# Patient Record
Sex: Male | Born: 1935 | Race: White | Hispanic: No | Smoking: Former smoker
Health system: Southern US, Community
[De-identification: ages and names within clinical notes are randomized; demographics above are authoritative.]

## PROBLEM LIST (undated history)

## (undated) DIAGNOSIS — E785 Hyperlipidemia, unspecified: Secondary | ICD-10-CM

## (undated) DIAGNOSIS — F0390 Unspecified dementia without behavioral disturbance: Secondary | ICD-10-CM

## (undated) DIAGNOSIS — I10 Essential (primary) hypertension: Secondary | ICD-10-CM

## (undated) DIAGNOSIS — J45909 Unspecified asthma, uncomplicated: Secondary | ICD-10-CM

## (undated) DIAGNOSIS — G4730 Sleep apnea, unspecified: Secondary | ICD-10-CM

## (undated) DIAGNOSIS — I2510 Atherosclerotic heart disease of native coronary artery without angina pectoris: Secondary | ICD-10-CM

## (undated) DIAGNOSIS — I219 Acute myocardial infarction, unspecified: Secondary | ICD-10-CM

## (undated) DIAGNOSIS — I4891 Unspecified atrial fibrillation: Secondary | ICD-10-CM

## (undated) DIAGNOSIS — M1990 Unspecified osteoarthritis, unspecified site: Secondary | ICD-10-CM

## (undated) HISTORY — PX: OTHER SURGICAL HISTORY: SHX169

## (undated) HISTORY — PX: KIDNEY STONE SURGERY: SHX686

## (undated) HISTORY — PX: BRAIN SURGERY: SHX531

## (undated) HISTORY — PX: CARDIAC SURGERY: SHX584

## (undated) HISTORY — PX: TONSILLECTOMY: SUR1361

## (undated) HISTORY — PX: CORONARY ARTERY BYPASS GRAFT: SHX141

---

## 2015-05-26 ENCOUNTER — Encounter (HOSPITAL_COMMUNITY): Payer: Self-pay | Admitting: Emergency Medicine

## 2015-05-26 ENCOUNTER — Emergency Department (HOSPITAL_COMMUNITY): Payer: Medicare HMO

## 2015-05-26 ENCOUNTER — Emergency Department (HOSPITAL_COMMUNITY)
Admission: EM | Admit: 2015-05-26 | Discharge: 2015-05-26 | Disposition: A | Discharge status: Home / Self Care | Payer: Medicare HMO | Attending: Emergency Medicine | Admitting: Emergency Medicine

## 2015-05-26 DIAGNOSIS — Z7951 Long term (current) use of inhaled steroids: Secondary | ICD-10-CM | POA: Diagnosis not present

## 2015-05-26 DIAGNOSIS — Z7982 Long term (current) use of aspirin: Secondary | ICD-10-CM | POA: Insufficient documentation

## 2015-05-26 DIAGNOSIS — J45909 Unspecified asthma, uncomplicated: Secondary | ICD-10-CM | POA: Diagnosis not present

## 2015-05-26 DIAGNOSIS — Z79899 Other long term (current) drug therapy: Secondary | ICD-10-CM | POA: Diagnosis not present

## 2015-05-26 DIAGNOSIS — Z87442 Personal history of urinary calculi: Secondary | ICD-10-CM | POA: Insufficient documentation

## 2015-05-26 DIAGNOSIS — R109 Unspecified abdominal pain: Secondary | ICD-10-CM | POA: Diagnosis present

## 2015-05-26 DIAGNOSIS — I10 Essential (primary) hypertension: Secondary | ICD-10-CM | POA: Diagnosis not present

## 2015-05-26 DIAGNOSIS — G8918 Other acute postprocedural pain: Secondary | ICD-10-CM | POA: Diagnosis not present

## 2015-05-26 DIAGNOSIS — M1990 Unspecified osteoarthritis, unspecified site: Secondary | ICD-10-CM | POA: Insufficient documentation

## 2015-05-26 DIAGNOSIS — T8384XA Pain from genitourinary prosthetic devices, implants and grafts, initial encounter: Secondary | ICD-10-CM

## 2015-05-26 HISTORY — DX: Essential (primary) hypertension: I10

## 2015-05-26 HISTORY — DX: Unspecified asthma, uncomplicated: J45.909

## 2015-05-26 HISTORY — DX: Unspecified osteoarthritis, unspecified site: M19.90

## 2015-05-26 LAB — URINALYSIS, ROUTINE W REFLEX MICROSCOPIC
Bilirubin Urine: NEGATIVE
GLUCOSE, UA: NEGATIVE mg/dL
KETONES UR: NEGATIVE mg/dL
Nitrite: NEGATIVE
Protein, ur: 30 mg/dL — AB
Specific Gravity, Urine: 1.011 (ref 1.005–1.030)
Urobilinogen, UA: 0.2 mg/dL (ref 0.0–1.0)
pH: 5.5 (ref 5.0–8.0)

## 2015-05-26 LAB — BASIC METABOLIC PANEL
Anion gap: 10 (ref 5–15)
BUN: 29 mg/dL — AB (ref 6–20)
CHLORIDE: 101 mmol/L (ref 101–111)
CO2: 31 mmol/L (ref 22–32)
CREATININE: 1.26 mg/dL — AB (ref 0.61–1.24)
Calcium: 9.5 mg/dL (ref 8.9–10.3)
GFR calc Af Amer: 59 mL/min — ABNORMAL LOW (ref >60)
GFR calc non Af Amer: 51 mL/min — ABNORMAL LOW (ref >60)
Glucose, Bld: 155 mg/dL — ABNORMAL HIGH (ref 65–99)
Potassium: 3.5 mmol/L (ref 3.5–5.1)
SODIUM: 142 mmol/L (ref 135–145)

## 2015-05-26 LAB — CBC WITH DIFFERENTIAL/PLATELET
BASOS PCT: 0 % (ref 0–1)
Basophils Absolute: 0 10*3/uL (ref 0.0–0.1)
EOS PCT: 2 % (ref 0–5)
Eosinophils Absolute: 0.2 10*3/uL (ref 0.0–0.7)
HCT: 42.2 % (ref 39.0–52.0)
HEMOGLOBIN: 14.9 g/dL (ref 13.0–17.0)
LYMPHS PCT: 13 % (ref 12–46)
Lymphs Abs: 1.4 10*3/uL (ref 0.7–4.0)
MCH: 31.2 pg (ref 26.0–34.0)
MCHC: 35.3 g/dL (ref 30.0–36.0)
MCV: 88.5 fL (ref 78.0–100.0)
MONOS PCT: 12 % (ref 3–12)
Monocytes Absolute: 1.3 10*3/uL — ABNORMAL HIGH (ref 0.1–1.0)
NEUTROS ABS: 8.3 10*3/uL — AB (ref 1.7–7.7)
NEUTROS PCT: 73 % (ref 43–77)
Platelets: 155 10*3/uL (ref 150–400)
RBC: 4.77 MIL/uL (ref 4.22–5.81)
RDW: 13.2 % (ref 11.5–15.5)
WBC: 11.2 10*3/uL — ABNORMAL HIGH (ref 4.0–10.5)

## 2015-05-26 LAB — URINE MICROSCOPIC-ADD ON

## 2015-05-26 MED ORDER — TAMSULOSIN HCL 0.4 MG PO CAPS: 0.4000 mg | ORAL_CAPSULE | Freq: Every day | ORAL | Status: DC

## 2015-05-26 NOTE — ED Provider Notes (Signed)
CSN: [id number]     Arrival date & time 05/26/15  1358 History   First MD Initiated Contact with Patient 05/26/15 1407     Chief Complaint  Patient presents with  . Flank Pain  . Urinary Retention     (Consider location/radiation/quality/duration/timing/severity/associated sxs/prior Treatment) Patient is a 81 y.o. male presenting with abdominal pain. The history is provided by the patient and a relative.  Abdominal Pain Pain location:  Suprapubic Pain quality: sharp and shooting   Pain radiates to:  Does not radiate Pain severity:  Moderate Onset quality:  Gradual Duration:  2 weeks Timing:  Constant Progression:  Worsening Chronicity:  New Relieved by:  Nothing Worsened by:  Urination Ineffective treatments:  None tried Associated symptoms: dysuria and hematuria   Associated symptoms: no chest pain, no chills, no diarrhea, no fever, no shortness of breath and no vomiting    81 yo M with a chief complaint of suprapubic abdominal pain. Patient is been going on for quite some time ever since he had a ureteral stent placed for a kidney stone. Patient just moved here from [place] has not yet gotten to see a urologist. Patient has had some blood with his urine. Patient denies fevers chills denies nausea or vomiting. Patient has been taking narcotics but only to help him sleep. Family is concerned because he fell about a month ago and they're concerned that the stent may have been displaced.  Past Medical History  Diagnosis Date  . Arthritis   . Asthma   . Hypertension    Past Surgical History  Procedure Laterality Date  . Cardiac surgery    . Coronary artery bypass graft    . Kidney stone surgery    . Brain surgery    . Brain bleed     History reviewed. No pertinent family history. History  Substance Use Topics  . Smoking status: Not on file  . Smokeless tobacco: Not on file  . Alcohol Use: Not on file    Review of Systems  Constitutional: Negative for fever and  chills.  HENT: Negative for congestion and facial swelling.   Eyes: Negative for discharge and visual disturbance.  Respiratory: Negative for shortness of breath.   Cardiovascular: Negative for chest pain and palpitations.  Gastrointestinal: Negative for vomiting, abdominal pain and diarrhea.  Genitourinary: Positive for dysuria and hematuria. Negative for flank pain, discharge, penile swelling, scrotal swelling, penile pain and testicular pain.  Musculoskeletal: Negative for myalgias and arthralgias.  Skin: Negative for color change and rash.  Neurological: Negative for tremors, syncope and headaches.  Psychiatric/Behavioral: Negative for confusion and dysphoric mood.      Allergies  Review of patient's allergies indicates no known allergies.  Home Medications   Prior to Admission medications   Medication Sig Start Date End Date Taking? Authorizing Provider  amLODipine (NORVASC) 10 MG tablet Take 5 mg by mouth daily.   Yes Historical Provider, MD  aspirin EC 81 MG tablet Take 81 mg by mouth daily.    Historical Provider, MD  Cholecalciferol (VITAMIN D PO) Take 1 tablet by mouth daily.   Yes Historical Provider, MD  ferrous sulfate 325 (65 FE) MG tablet Take 325 mg by mouth daily with breakfast.   Yes Historical Provider, MD  fluticasone (FLONASE) 50 MCG/ACT nasal spray Place 2 sprays into both nostrils daily.   Yes Historical Provider, MD  furosemide (LASIX) 40 MG tablet Take 40 mg by mouth daily.   Yes Historical Provider, MD  HYDROcodone-acetaminophen (NORCO/VICODIN)  5-325 MG per tablet Take 1 tablet by mouth at bedtime as needed for moderate pain.   Yes Historical Provider, MD  mometasone [facility]) 220 MCG/INH inhaler Inhale 2 puffs into the lungs at bedtime.   Yes Historical Provider, MD  Omega-3 Fatty Acids (FISH OIL PO) Take 1 capsule by mouth daily.   Yes Historical Provider, MD  polyethylene glycol (MIRALAX / GLYCOLAX) packet Take 17 g by mouth daily as needed for moderate  constipation.   Yes Historical Provider, MD  simvastatin (ZOCOR) 40 MG tablet Take 20 mg by mouth at bedtime.   Yes Historical Provider, MD  tamsulosin (FLOMAX) 0.4 MG CAPS capsule Take 1 capsule (0.4 mg total) by mouth daily after supper. 05/26/15   Melene Plan, DO  traZODone (DESYREL) 50 MG tablet Take 50 mg by mouth at bedtime.   Yes Historical Provider, MD   BP 155/74 mmHg  Pulse 81  Temp(Src) 98.8 F (37.1 C) (Oral)  Resp 18  SpO2 99% Physical Exam  Constitutional: He is oriented to person, place, and time. He appears well-developed and well-nourished.  HENT:  Head: Normocephalic and atraumatic.  Eyes: EOM are normal. Pupils are equal, round, and reactive to light.  Neck: Normal range of motion. Neck supple. No JVD present.  Cardiovascular: Normal rate and regular rhythm.  Exam reveals no gallop and no friction rub.   No murmur heard. Pulmonary/Chest: No respiratory distress. He has no wheezes.  Abdominal: He exhibits no distension. There is tenderness (suprapubic). There is no rebound and no guarding.  Musculoskeletal: Normal range of motion. He exhibits no edema or tenderness.  Neurological: He is alert and oriented to person, place, and time.  Skin: No rash noted. No pallor.  Psychiatric: He has a normal mood and affect. His behavior is normal.    ED Course  Procedures (including critical care time) Labs Review Labs Reviewed  CBC WITH DIFFERENTIAL/PLATELET - Abnormal; Notable for the following:    WBC 11.2 (*)    Neutro Abs 8.3 (*)    Monocytes Absolute 1.3 (*)    All other components within normal limits  BASIC METABOLIC PANEL - Abnormal; Notable for the following:    Glucose, Bld 155 (*)    BUN 29 (*)    Creatinine, Ser 1.26 (*)    GFR calc non Af Amer 51 (*)    GFR calc Af Amer 59 (*)    All other components within normal limits  URINALYSIS, ROUTINE W REFLEX MICROSCOPIC (NOT AT [facility]) - Abnormal; Notable for the following:    APPearance CLOUDY (*)    Hgb urine  dipstick LARGE (*)    Protein, ur 30 (*)    Leukocytes, UA MODERATE (*)    All other components within normal limits  URINE MICROSCOPIC-ADD ON - Abnormal; Notable for the following:    Bacteria, UA FEW (*)    All other components within normal limits  URINE CULTURE    Imaging Review Dg Abd 1 View  05/26/2015   CLINICAL DATA:  Right flank ecchymosis, hematuria, right mid abdominal pain. Placement right nephro ureteral stent remotely.  EXAM: ABDOMEN - 1 VIEW  COMPARISON:  None.  FINDINGS: Right nephro ureteral stent in place with proximal portion at the expected location of the right kidney, although the kidney itself is not well visualized due to overlying bowel gas and stool, and distal portion projecting over the expected location of the bladder. Bones are subjectively osteopenic. No abnormal radiopacity is identified projecting along the length of  the right sided stent. Punctate radiopacities over the right upper renal pole region and left lower renal pole region could represent renal calculi although bowel content could appear similar. Vascular calcifications are noted.  IMPRESSION: Right-sided nephro ureteral stent in apparently good position radiographically, although position and presence or absence of complication would be best evaluated at CT with contrast.   Electronically Signed   By: Christiana Pellant M.D.   On: 05/26/2015 15:48     EKG Interpretation None      MDM   Final diagnoses:  Pain due to ureteral stent, initial encounter    81 yo M with suprapubic abdominal pain. Will obtain a urine CBC BMP, KUB to evaluate stent location.  KUB with sentinel good position as read by me. Lab work with mild leukocytosis as well as creatinine of 1.26 no baseline to compare. UA moderate leukocytes only 3- 6 whites and few bacteria. Urine sent for culture and will not treat with this dipstick result.  Urology follow-up. Trial of flomax for bladder spasms.  4:05 PM:  I have discussed the  diagnosis/risks/treatment options with the patient and family and believe the pt to be eligible for discharge home to follow-up with Urology. We also discussed returning to the ED immediately if new or worsening sx occur. We discussed the sx which are most concerning (e.g., fever, vomting) that necessitate immediate return. Medications administered to the patient during their visit and any new prescriptions provided to the patient are listed below.  Medications given during this visit Medications - No data to display  New Prescriptions   TAMSULOSIN (FLOMAX) 0.4 MG CAPS CAPSULE    Take 1 capsule (0.4 mg total) by mouth daily after supper.     The patient appears reasonably screen and/or stabilized for discharge and I doubt any other medical condition or other [facility] requiring further screening, evaluation, or treatment in the ED at this time prior to discharge.    Melene Plan, DO 05/26/15 1606

## 2015-05-26 NOTE — ED Notes (Signed)
Pt complaining of right mid abdominal pain starting today. Hx of a uretal stent placed to right side x 4 months ago, had a fall x 1 month ago and daughter states he bruised the right flank. Today large pink/purple bruise to right flank visible on exam. States since he had the stent he's had a small amount of blood in his urine since, and has only been able to urinate in small amounts multiple times throughout the day since his fall.

## 2015-05-27 LAB — URINE CULTURE: Culture: 3000

## 2015-09-05 ENCOUNTER — Other Ambulatory Visit: Payer: Self-pay | Admitting: Urology

## 2015-09-22 NOTE — Patient Instructions (Addendum)
Jacqlyn LarsenRichard Brereton  09/22/2015   Your procedure is scheduled on: 10/03/2015    Report [facility]  Entrance take [place]  elevators to 3rd floor to  Short Stay Center at    0530 AM.  Call this number if you have problems the morning of surgery [phone]   Remember: ONLY 1 PERSON MAY GO WITH YOU TO SHORT STAY TO GET  READY MORNING OF YOUR SURGERY.  Do not eat food or drink liquids :After Midnight.     Take these medicines the morning of surgery with A SIP OF WATER:   Amlodipine ( NOrvasc),, Flonase, Asmanex inhaler and bring                                 You may not have any metal on your body including hair pins and              piercings  Do not wear jewelry,  lotions, powders or perfumes, deodorant                         Men may shave face and neck.   Do not bring valuables to the hospital. [facility] IS NOT             RESPONSIBLE   FOR VALUABLES.  Contacts, dentures or bridgework may not be worn into surgery.       Patients discharged the day of surgery will not be allowed to drive home.  Name and phone number of your driver:  Special Instructions:coughing and deep breathing exercises, leg exercises               Please read over the following fact sheets you were given: _____________________________________________________________________             [facility] - Preparing for Surgery Before surgery, you can play an important role.  Because skin is not sterile, your skin needs to be as free of germs as possible.  You can reduce the number of germs on your skin by washing with CHG (chlorahexidine gluconate) soap before surgery.  CHG is an antiseptic cleaner which kills germs and bonds with the skin to continue killing germs even after washing. Please DO NOT use if you have an allergy to CHG or antibacterial soaps.  If your skin becomes reddened/irritated stop using the CHG and inform your nurse when you arrive at Short Stay. Do not shave  (including legs and underarms) for at least 48 hours prior to the first CHG shower.  You may shave your face/neck. Please follow these instructions carefully:  1.  Shower with CHG Soap the night before surgery and the  morning of Surgery.  2.  If you choose to wash your hair, wash your hair first as usual with your  normal  shampoo.  3.  After you shampoo, rinse your hair and body thoroughly to remove the  shampoo.                           4.  Use CHG as you would any other liquid soap.  You can apply chg directly  to the skin and wash  Gently with a scrungie or clean washcloth.  5.  Apply the CHG Soap to your body ONLY FROM THE NECK DOWN.   Do not use on face/ open                           Wound or open sores. Avoid contact with eyes, ears mouth and genitals (private parts).                       Wash face,  Genitals (private parts) with your normal soap.             6.  Wash thoroughly, paying special attention to the area where your surgery  will be performed.  7.  Thoroughly rinse your body with warm water from the neck down.  8.  DO NOT shower/wash with your normal soap after using and rinsing off  the CHG Soap.                9.  Pat yourself dry with a clean towel.            10.  Wear clean pajamas.            11.  Place clean sheets on your bed the night of your first shower and do not  sleep with pets. Day of Surgery : Do not apply any lotions/deodorants the morning of surgery.  Please wear clean clothes to the hospital/surgery center.  FAILURE TO FOLLOW THESE INSTRUCTIONS MAY RESULT IN THE CANCELLATION OF YOUR SURGERY PATIENT SIGNATURE_________________________________  NURSE SIGNATURE__________________________________  ________________________________________________________________________

## 2015-09-26 ENCOUNTER — Encounter (HOSPITAL_COMMUNITY)
Admission: RE | Admit: 2015-09-26 | Discharge: 2015-09-26 | Disposition: A | Discharge status: Home / Self Care | Payer: Medicare HMO | Source: Ambulatory Visit | Attending: Urology | Admitting: Urology

## 2015-09-26 ENCOUNTER — Encounter (HOSPITAL_COMMUNITY): Payer: Self-pay

## 2015-09-26 DIAGNOSIS — Z01818 Encounter for other preprocedural examination: Secondary | ICD-10-CM | POA: Diagnosis present

## 2015-09-26 HISTORY — DX: Acute myocardial infarction, unspecified: I21.9

## 2015-09-26 HISTORY — DX: Atherosclerotic heart disease of native coronary artery without angina pectoris: I25.10

## 2015-09-26 HISTORY — DX: Sleep apnea, unspecified: G47.30

## 2015-09-26 LAB — CBC
HEMATOCRIT: 41.7 % (ref 39.0–52.0)
Hemoglobin: 14.4 g/dL (ref 13.0–17.0)
MCH: 30.1 pg (ref 26.0–34.0)
MCHC: 34.5 g/dL (ref 30.0–36.0)
MCV: 87.2 fL (ref 78.0–100.0)
PLATELETS: 231 10*3/uL (ref 150–400)
RBC: 4.78 MIL/uL (ref 4.22–5.81)
RDW: 13.1 % (ref 11.5–15.5)
WBC: 8.8 10*3/uL (ref 4.0–10.5)

## 2015-09-26 LAB — BASIC METABOLIC PANEL
Anion gap: 8 (ref 5–15)
BUN: 15 mg/dL (ref 6–20)
CO2: 34 mmol/L — ABNORMAL HIGH (ref 22–32)
CREATININE: 0.97 mg/dL (ref 0.61–1.24)
Calcium: 9.6 mg/dL (ref 8.9–10.3)
Chloride: 99 mmol/L — ABNORMAL LOW (ref 101–111)
GFR calc Af Amer: >60 mL/min (ref >60)
GLUCOSE: 198 mg/dL — AB (ref 65–99)
POTASSIUM: 3.5 mmol/L (ref 3.5–5.1)
SODIUM: 141 mmol/L (ref 135–145)

## 2015-09-26 NOTE — Progress Notes (Signed)
Requested by fax from [facility] in [place] with Signed Medical Release Form - LOV, EKG , Stress, Echo and any other recent heart tests done.

## 2015-09-26 NOTE — Progress Notes (Signed)
PCP note0 04/18/15 on chart  ECHO-4.21.15- on chart  CT abdomen and pelvis- 02/17/15 on chart  D/C summary- 02/08/14 on chart  Stress Test- 10/27/13 on chart  03/04/15- EKG on chart

## 2015-09-30 NOTE — Progress Notes (Signed)
Received from [facility] in [place]: 07/27/2015- ECHO 07/27/15- EKG  Cardiology Follow Up Note- 07/27/15 and 07/19/15

## 2015-09-30 NOTE — Progress Notes (Signed)
On 09/28/2015 rerequested info for 2nd time from [facility] [place] [facility].

## 2015-09-30 NOTE — Progress Notes (Signed)
Called [facility] in [place] at [phone] Ext [phone] and requested medical record info by phone.  They stated they would send medical records from [facility] in [place].

## 2015-10-02 ENCOUNTER — Encounter (HOSPITAL_COMMUNITY): Payer: Self-pay | Admitting: Anesthesiology

## 2015-10-02 NOTE — Anesthesia Preprocedure Evaluation (Addendum)
Anesthesia Evaluation  Patient identified by MRN, date of birth, ID band Patient awake    Reviewed: Allergy & Precautions, NPO status , Patient's Chart, lab work & pertinent test results  Airway Mallampati: II  TM Distance: >3 FB Neck ROM: Full    Dental no notable dental hx.    Pulmonary asthma , sleep apnea , former smoker,    Pulmonary exam normal breath sounds clear to auscultation       Cardiovascular hypertension, Pt. on medications + CAD, + Past MI and + CABG  Normal cardiovascular exam Rhythm:Regular Rate:Normal     Neuro/Psych negative neurological ROS  negative psych ROS   GI/Hepatic negative GI ROS, Neg liver ROS,   Endo/Other  negative endocrine ROS  Renal/GU negative Renal ROS  negative genitourinary   Musculoskeletal  (+) Arthritis ,   Abdominal   Peds negative pediatric ROS (+)  Hematology negative hematology ROS (+)   Anesthesia Other Findings   Reproductive/Obstetrics negative OB ROS                            Anesthesia Physical Anesthesia Plan  ASA: III  Anesthesia Plan: General   Post-op Pain Management:    Induction: Intravenous  Airway Management Planned: LMA  Additional Equipment:   Intra-op Plan:   Post-operative Plan: Extubation in OR  Informed Consent: I have reviewed the patients History and Physical, chart, labs and discussed the procedure including the risks, benefits and alternatives for the proposed anesthesia with the patient or authorized representative who has indicated his/her understanding and acceptance.   Dental advisory given  Plan Discussed with: CRNA  Anesthesia Plan Comments:         Anesthesia Quick Evaluation

## 2015-10-03 ENCOUNTER — Ambulatory Visit (HOSPITAL_COMMUNITY): Payer: No Typology Code available for payment source

## 2015-10-03 ENCOUNTER — Ambulatory Visit (HOSPITAL_COMMUNITY): Payer: No Typology Code available for payment source | Admitting: Anesthesiology

## 2015-10-03 ENCOUNTER — Encounter (HOSPITAL_COMMUNITY): Payer: Self-pay | Admitting: *Deleted

## 2015-10-03 ENCOUNTER — Encounter (HOSPITAL_COMMUNITY)
Admission: RE | Disposition: A | Discharge status: Home / Self Care | Payer: Self-pay | Source: Ambulatory Visit | Attending: Urology

## 2015-10-03 ENCOUNTER — Ambulatory Visit (HOSPITAL_COMMUNITY)
Admission: RE | Admit: 2015-10-03 | Discharge: 2015-10-03 | Disposition: A | Discharge status: Home / Self Care | Payer: No Typology Code available for payment source | Source: Ambulatory Visit | Attending: Urology | Admitting: Urology

## 2015-10-03 DIAGNOSIS — I4891 Unspecified atrial fibrillation: Secondary | ICD-10-CM | POA: Insufficient documentation

## 2015-10-03 DIAGNOSIS — I252 Old myocardial infarction: Secondary | ICD-10-CM | POA: Insufficient documentation

## 2015-10-03 DIAGNOSIS — I2510 Atherosclerotic heart disease of native coronary artery without angina pectoris: Secondary | ICD-10-CM | POA: Insufficient documentation

## 2015-10-03 DIAGNOSIS — I509 Heart failure, unspecified: Secondary | ICD-10-CM | POA: Diagnosis not present

## 2015-10-03 DIAGNOSIS — Z7951 Long term (current) use of inhaled steroids: Secondary | ICD-10-CM | POA: Insufficient documentation

## 2015-10-03 DIAGNOSIS — Z7982 Long term (current) use of aspirin: Secondary | ICD-10-CM | POA: Insufficient documentation

## 2015-10-03 DIAGNOSIS — Z87891 Personal history of nicotine dependence: Secondary | ICD-10-CM | POA: Insufficient documentation

## 2015-10-03 DIAGNOSIS — Z79899 Other long term (current) drug therapy: Secondary | ICD-10-CM | POA: Insufficient documentation

## 2015-10-03 DIAGNOSIS — R350 Frequency of micturition: Secondary | ICD-10-CM | POA: Diagnosis present

## 2015-10-03 DIAGNOSIS — J45909 Unspecified asthma, uncomplicated: Secondary | ICD-10-CM | POA: Insufficient documentation

## 2015-10-03 DIAGNOSIS — G2581 Restless legs syndrome: Secondary | ICD-10-CM | POA: Insufficient documentation

## 2015-10-03 DIAGNOSIS — I110 Hypertensive heart disease with heart failure: Secondary | ICD-10-CM | POA: Diagnosis not present

## 2015-10-03 DIAGNOSIS — Z79891 Long term (current) use of opiate analgesic: Secondary | ICD-10-CM | POA: Diagnosis not present

## 2015-10-03 DIAGNOSIS — E119 Type 2 diabetes mellitus without complications: Secondary | ICD-10-CM | POA: Insufficient documentation

## 2015-10-03 DIAGNOSIS — M1990 Unspecified osteoarthritis, unspecified site: Secondary | ICD-10-CM | POA: Insufficient documentation

## 2015-10-03 DIAGNOSIS — N201 Calculus of ureter: Secondary | ICD-10-CM | POA: Insufficient documentation

## 2015-10-03 DIAGNOSIS — I714 Abdominal aortic aneurysm, without rupture: Secondary | ICD-10-CM | POA: Insufficient documentation

## 2015-10-03 DIAGNOSIS — E785 Hyperlipidemia, unspecified: Secondary | ICD-10-CM | POA: Insufficient documentation

## 2015-10-03 DIAGNOSIS — Z951 Presence of aortocoronary bypass graft: Secondary | ICD-10-CM | POA: Diagnosis not present

## 2015-10-03 DIAGNOSIS — Z87442 Personal history of urinary calculi: Secondary | ICD-10-CM | POA: Diagnosis not present

## 2015-10-03 DIAGNOSIS — G4730 Sleep apnea, unspecified: Secondary | ICD-10-CM | POA: Insufficient documentation

## 2015-10-03 HISTORY — PX: CYSTOSCOPY W/ URETERAL STENT REMOVAL: SHX1430

## 2015-10-03 SURGERY — REMOVAL, STENT, URETER, CYSTOSCOPIC
Anesthesia: General | Site: Ureter | Laterality: Right

## 2015-10-03 MED ORDER — PROPOFOL 10 MG/ML IV BOLUS
INTRAVENOUS | Status: AC
  Filled 2015-10-03: qty 20

## 2015-10-03 MED ORDER — PHENAZOPYRIDINE HCL 200 MG PO TABS: 200.0000 mg | ORAL_TABLET | Freq: Three times a day (TID) | ORAL | Status: DC | PRN

## 2015-10-03 MED ORDER — PROPOFOL 10 MG/ML IV BOLUS
INTRAVENOUS | Status: DC | PRN
  Administered 2015-10-03: 150 mg via INTRAVENOUS
  Administered 2015-10-03: 50 mg via INTRAVENOUS
  Administered 2015-10-03 (×3): 20 mg via INTRAVENOUS

## 2015-10-03 MED ORDER — PHENAZOPYRIDINE HCL 200 MG PO TABS
200.0000 mg | ORAL_TABLET | Freq: Once | ORAL | Status: AC
  Administered 2015-10-03: 200 mg via ORAL

## 2015-10-03 MED ORDER — PHENAZOPYRIDINE HCL 200 MG PO TABS
ORAL_TABLET | ORAL | Status: AC
  Filled 2015-10-03: qty 1

## 2015-10-03 MED ORDER — FENTANYL CITRATE (PF) 100 MCG/2ML IJ SOLN
INTRAMUSCULAR | Status: AC
  Filled 2015-10-03: qty 2

## 2015-10-03 MED ORDER — HYDROCODONE-ACETAMINOPHEN 7.5-325 MG PO TABS
1.0000 | ORAL_TABLET | Freq: Four times a day (QID) | ORAL | Status: DC | PRN
  Administered 2015-10-03: 1 via ORAL
  Filled 2015-10-03: qty 1

## 2015-10-03 MED ORDER — FENTANYL CITRATE (PF) 100 MCG/2ML IJ SOLN
INTRAMUSCULAR | Status: DC | PRN
  Administered 2015-10-03: 50 ug via INTRAVENOUS
  Administered 2015-10-03 (×2): 25 ug via INTRAVENOUS

## 2015-10-03 MED ORDER — LACTATED RINGERS IV SOLN: INTRAVENOUS | Status: DC

## 2015-10-03 MED ORDER — HYDROCODONE-ACETAMINOPHEN 7.5-325 MG PO TABS: 1.0000 | ORAL_TABLET | ORAL | Status: AC | PRN

## 2015-10-03 MED ORDER — CIPROFLOXACIN IN D5W 400 MG/200ML IV SOLN
400.0000 mg | INTRAVENOUS | Status: AC
  Administered 2015-10-03: 400 mg via INTRAVENOUS

## 2015-10-03 MED ORDER — IOHEXOL 300 MG/ML  SOLN
INTRAMUSCULAR | Status: DC | PRN
  Administered 2015-10-03: 20 mL

## 2015-10-03 MED ORDER — LACTATED RINGERS IV SOLN
INTRAVENOUS | Status: DC | PRN
  Administered 2015-10-03: 07:00:00 via INTRAVENOUS

## 2015-10-03 MED ORDER — CIPROFLOXACIN IN D5W 400 MG/200ML IV SOLN
INTRAVENOUS | Status: AC
  Filled 2015-10-03: qty 200

## 2015-10-03 MED ORDER — SODIUM CHLORIDE 0.9 % IR SOLN
Status: DC | PRN
  Administered 2015-10-03: 4000 mL

## 2015-10-03 MED ORDER — FENTANYL CITRATE (PF) 100 MCG/2ML IJ SOLN: 25.0000 ug | INTRAMUSCULAR | Status: DC | PRN

## 2015-10-03 SURGICAL SUPPLY — 19 items
BAG URO CATCHER STRL LF (DRAPE) ×3 IMPLANT
BASKET ZERO TIP NITINOL 2.4FR (BASKET) IMPLANT
CATH INTERMIT  6FR 70CM (CATHETERS) ×3 IMPLANT
CLOTH BEACON ORANGE TIMEOUT ST (SAFETY) ×3 IMPLANT
FIBER LASER FLEXIVA 1000 (UROLOGICAL SUPPLIES) IMPLANT
FIBER LASER FLEXIVA 200 (UROLOGICAL SUPPLIES) IMPLANT
FIBER LASER FLEXIVA 365 (UROLOGICAL SUPPLIES) IMPLANT
FIBER LASER FLEXIVA 550 (UROLOGICAL SUPPLIES) IMPLANT
FIBER LASER TRAC TIP (UROLOGICAL SUPPLIES) IMPLANT
GLOVE BIOGEL M 8.0 STRL (GLOVE) ×6 IMPLANT
GOWN STRL REUS W/ TWL XL LVL3 (GOWN DISPOSABLE) ×2 IMPLANT
GOWN STRL REUS W/TWL XL LVL3 (GOWN DISPOSABLE) ×4 IMPLANT
GUIDEWIRE ANG ZIPWIRE 038X150 (WIRE) IMPLANT
GUIDEWIRE COONS BENTSON MOVE (WIRE) ×3 IMPLANT
GUIDEWIRE STR DUAL SENSOR (WIRE) ×3 IMPLANT
MANIFOLD NEPTUNE II (INSTRUMENTS) ×3 IMPLANT
PACK CYSTO (CUSTOM PROCEDURE TRAY) ×3 IMPLANT
STENT CONTOUR 6FRX26X.038 (STENTS) ×3 IMPLANT
TUBING CONNECTING 10 (TUBING) ×3 IMPLANT

## 2015-10-03 NOTE — Anesthesia Postprocedure Evaluation (Signed)
Anesthesia Post Note  Patient: Austin Roberts  Procedure(s) Performed: Procedure(s) (LRB): CYSTOSCOPY WITH RIGHT STENT REMOVAL, RETROGRADE WITH RIGHT STENT PLACEMENT (Right)  Patient location during evaluation: PACU Anesthesia Type: General Level of consciousness: awake and alert Pain management: pain level controlled Vital Signs Assessment: post-procedure vital signs reviewed and stable Respiratory status: spontaneous breathing, nonlabored ventilation, respiratory function stable and patient connected to nasal cannula oxygen Cardiovascular status: blood pressure returned to baseline and stable Postop Assessment: no signs of nausea or vomiting Anesthetic complications: no    Last Vitals:  Filed Vitals:   10/03/15 0845 10/03/15 0900  BP: 171/63 177/67  Pulse: 66 80  Temp: 36.7 C 36.6 C  Resp: 13 14    Last Pain:  Filed Vitals:   10/03/15 0956  PainSc: 2                  [name] J

## 2015-10-03 NOTE — Transfer of Care (Signed)
Immediate Anesthesia Transfer of Care Note  Patient: Austin Roberts  Procedure(s) Performed: Procedure(s): CYSTOSCOPY WITH RIGHT STENT REMOVAL, RETROGRADE WITH RIGHT STENT PLACEMENT (Right)  Patient Location: PACU  Anesthesia Type:General  Level of Consciousness:  sedated, patient cooperative and responds to stimulation  Airway & Oxygen Therapy:Patient Spontanous Breathing and Patient connected to face mask oxgen  Post-op Assessment:  Report given to PACU RN and Post -op Vital signs reviewed and stable  Post vital signs:  Reviewed and stable  Last Vitals:  Filed Vitals:   10/03/15 0505 10/03/15 0815  BP: 147/73 157/99  Pulse: 89 80  Temp: 36.6 C   Resp: 16 17    Complications: No apparent anesthesia complications

## 2015-10-03 NOTE — H&P (Signed)
Austin Roberts is an 81 year old male with a history of calculus disease and a right ureteral stent.   History of Present Illness He was seen in the emergency room on 05/26/15 at that time experiencing a 2 week history of sharp shooting pain in the suprapubic region associated with dysuria and hematuria. He reported having moved from [place] and having a stent in his right ureter. A KUB obtained at that time revealed bilateral multiple renal calculi and a stent in the right ureter in good position. I received from the [facility] that indicated a question of stent removal and a KUB obtained which revealed the stent remaining present within the ureter.  He has a somewhat complex urologic history in that he underwent left ureteroscopy for ureteral stone and a stent was left but removed. He also had a stone on the right hand side and therefore underwent right ureteroscopy and had a stent placed back in 5/16. He then moved away to live with his daughter down here in [place] and there appears to be in a lot of time with him trying to get into the [facility] and eventually he underwent cystoscopy at the [facility] and they attempted to remove the stent but were unsuccessful and said that they curled up the stent in his bladder. He is having a lot of frequency and urgency.  He has undergone a previous TURP.   Past Medical History Problems  1. History of Coronary artery disease (I25.10) 2. History of abdominal aortic aneurysm (AAA) (Z86.79) 3. History of asthma (Z87.09) 4. History of atrial fibrillation (Z86.79) 5. History of congestive heart failure (Z86.79) 6. History of hyperlipidemia (Z86.39) 7. History of hypertension (Z86.79) 8. History of sleep apnea (Z87.09) 9. History of type 2 diabetes mellitus (Z86.39) 10. History of Restless leg syndrome (G25.81)  Surgical History Problems  1. History of CABG 2. History of Transurethral Resection Of Prostate (TURP)  Current Meds 1. AmLODIPine Besylate 10 MG Oral Tablet;  Therapy: (Recorded:11Nov2016) to Recorded 2. Asmanex 30 Metered Doses 220 MCG/INH Inhalation Aerosol Powder Breath Activated;  Therapy: (Recorded:11Nov2016) to Recorded 3. Aspirin 81 MG TABS;  Therapy: (Recorded:11Nov2016) to Recorded 4. Ferrous Sulfate CAPS;  Therapy: (Recorded:11Nov2016) to Recorded 5. Fluticasone Propionate 50 MCG/ACT Nasal Suspension;  Therapy: (Recorded:11Nov2016) to Recorded 6. Furosemide 40 MG Oral Tablet;  Therapy: (Recorded:11Nov2016) to Recorded 7. Hydrocodone-Acetaminophen 7.5-325 MG Oral Tablet;  Therapy: (Recorded:11Nov2016) to Recorded 8. MiraLax Oral Powder;  Therapy: (Recorded:11Nov2016) to Recorded 9. Simvastatin 20 MG Oral Tablet;  Therapy: (Recorded:11Nov2016) to Recorded 10. Systane SOLN;   Therapy: (Recorded:11Nov2016) to Recorded 11. TraZODone HCl - 50 MG Oral Tablet;   Therapy: (Recorded:11Nov2016) to Recorded 12. Vitamin D3 CAPS;   Therapy: (Recorded:11Nov2016) to Recorded  Allergies Medication  1. doxazosin  Family History Problems  1. Family history of malignant neoplasm (Z80.9) : Sister 2. Family history of malignant neoplasm of brain (Z80.8) : Father 3. Family history of stroke (Z82.3) : Mother  Social History Problems    Denied: History of Alcohol use   Caffeine use (F15.90)   Former smoker [phone](Z87.891)   1ppdx50 years ago   Number of children   1 daughter  Review of Systems Genitourinary, constitutional, skin, eye, otolaryngeal, hematologic/lymphatic, cardiovascular, pulmonary, endocrine, musculoskeletal, gastrointestinal, neurological and psychiatric system(s) were reviewed and pertinent findings if present are noted and are otherwise negative.  Genitourinary: urinary frequency, nocturia, weak urinary stream and urinary stream starts and stops.  Gastrointestinal: constipation.  Constitutional: recent weight loss.  Musculoskeletal: joint pain.  Psychiatric: anxiety.  Vitals Vital Signs Height: 5 ft 10 in Weight:  171 lb  BMI Calculated: 24.54 BSA Calculated: 1.95 Blood Pressure: 154 / 76 Heart Rate: 64  Physical Exam Constitutional: Well nourished and well developed . No acute distress.   ENT:. The ears and nose are normal in appearance.   Neck: The appearance of the neck is normal and no neck mass is present.   Pulmonary: No respiratory distress and normal respiratory rhythm and effort.   Cardiovascular: Heart rate and rhythm are normal . No peripheral edema.   Abdomen: The abdomen is soft and nontender. No masses are palpated. No CVA tenderness. No hernias are palpable. No hepatosplenomegaly noted.   Lymphatics: The femoral and inguinal nodes are not enlarged or tender.   Skin: Normal skin turgor, no visible rash and no visible skin lesions.   Neuro/Psych:. Mood and affect are appropriate.    Results/Data  Old records or history reviewed: ER notes as above.  The following images/tracing/specimen were independently visualized:  KUB as above.      Assessment   I obtained a KUB and the stent appears to have been pulled down into the bladder part way although still remains up near the ureteropelvic junction on the right hand side. I cannot see any definite calcification along the stent but I suspect that's why they could not remove the stent at the [facility].  I had a long discussion with the patient and his daughter about the fact that this is a very difficult case because the stent likely has some calcification buildup on it. Because of that I'm not going to try to remove the stent in the office but will take him to the operating room and I told him there I would attempt to remove the stent but likely would also consider placing a guidewire next to the stent to maintain access and might even leave a second stent in place next to his current stent to allow his ureter to dilate further and possibly allow ureteroscopy to be performed with laser lithotripsy of stone on his stent without having to go  percutaneously to obtain the remainder of his stent. We discussed this procedure at length as well as the possible risks and complications, the probability of success, the possible need for second and possibly even a third procedure, the outpatient nature of the procedure as well as the anticipated postoperative course. I told him I would check a urine culture in preparation for his surgery because of his irritative voiding symptoms and we'll schedule him for this surgery.   Plan   1. His urine was cultured preoperatively and found to be negative.   2. He will be scheduled for cystoscopy, right stent removal, possible laser lithotripsy, possible ureteroscopy and right stent replacement.

## 2015-10-03 NOTE — Anesthesia Procedure Notes (Signed)
Procedure Name: LMA Insertion Date/Time: 10/03/2015 7:37 AM Performed by: Paris LoreBLANTON, Juliyah Mergen M Pre-anesthesia Checklist: Patient identified, Emergency Drugs available, Suction available, Patient being monitored and Timeout performed Patient Re-evaluated:Patient Re-evaluated prior to inductionOxygen Delivery Method: Circle system utilized Preoxygenation: Pre-oxygenation with 100% oxygen Intubation Type: IV induction Ventilation: Mask ventilation without difficulty LMA: LMA inserted LMA Size: 4.0 Number of attempts: 1 Placement Confirmation: positive ETCO2 and breath sounds checked- equal and bilateral Tube secured with: Tape

## 2015-10-03 NOTE — Discharge Instructions (Signed)

## 2015-10-03 NOTE — Op Note (Signed)
PATIENT:  Austin Roberts  PRE-OPERATIVE DIAGNOSIS: Retained right double-J stent  POST-OPERATIVE DIAGNOSIS: Same  PROCEDURE: 1. Cystoscopy with right retrograde pyelogram including interpretation 2. Cystolitholapaxy 3. Removal of retained stent 4. Placement of new double-J stent  SURGEON:  Garnett FarmMark C Isiaah Cuervo  INDICATION: Austin Roberts is a 81 year old male with a history of calculus disease. He underwent stent placement after treatment of a stone in his right ureter in 5/16. [facility] [place] [facility] [place] and [facility] and [facility]. This had been taking quite a bit of time and he underwent an attempt at removal of his stent but this was unsuccessful. He does not appear to have a great deal of encrustation of the proximal portion of the stent which has pulled down into the area of the UPJ. He is brought to the operating room today for an attempt at removal of his retained stent.  ANESTHESIA:  General  EBL:  Minimal  DRAINS: 6 French, 24 cm double-J stent in the right ureter (with string)  LOCAL MEDICATIONS USED:  None  SPECIMEN:  None  Description of procedure: After informed consent the patient was taken to the operating room and placed on the table in a supine position. General anesthesia was then administered. Once fully anesthetized the patient was moved to the dorsal lithotomy position and the genitalia were sterilely prepped and draped in standard fashion. An official timeout was then performed.  The 23 French rigid cystoscope with 30 lens was then passed down the urethra which is noted be normal. The prostatic urethra revealed evidence of prior resection and there was no evidence of any lesions within the prostatic urethra. Upon entering the bladder I noted the left ureteral orifice was of normal configuration and position and the right ureteral orifice had some edema surrounding it with the stent exiting the ureteral orifice. The stent had  significant encrustation and actually had become fused together where the curl of the stent brought the 2 portions of the stent in approximation. There was 2+ trabeculation. No tumors or other lesions were identified.  I first performed a right retrograde pyelogram. This was performed by passing a 6 [place]French open-ended catheter through the cystoscope and I attempted to pass it into the right ureteral orifice next to the stent but was unsuccessful. I therefore passed a 0.038 inch floppy-tipped sensor guidewire through the open-ended catheter, into the right ureteral orifice and up the right ureter next to the stent into the area of the renal pelvis directed by real-time fluoroscopy. I then advanced the open-ended catheter over the guidewire to the level of the ureteropelvic junction and remove the guidewire. Under fluoroscopy I injected full-strength Omnipaque contrast through the open-ended stent which outlined the renal pelvis which appeared to be generous with small, short infundibule portending each calyx. The calyces themselves did not appear to be dilated. I then passed the guidewire back through the open-ended stent and left this in the area of the renal pelvis and removed the open-ended stent.  I then passed the alligator forceps through the cystoscope and performed cystolitholapaxy by crushing the stone that was incrusting his stent in order to free up the loops of the stent and free up the distal aspect so that it could be grasped. I was able to grasp the tip of the stent and I withdrew this through the cystoscope maintaining the tip of the cystoscope at the ureteral orifice for stability. As I did this I was able  to pull the stent out with little to no resistance whatsoever and the entire stent was removed. It was inspected and had minimal encrustation on the proximal portion.  I backloaded the cystoscope over the guidewire and passed a new stent over the guidewire into the area the renal pelvis as the  guidewire was removed there was good curl noted on the proximal aspect of the stent and of the distal aspect within the bladder. The bladder was then drained and the cystoscope removed. The tether on the distal aspect of the stent was affixed to the dorsum of the penis and the patient was awakened and taken to recovery room in stable and satisfactory condition. There were no intraoperative complications.  PLAN OF CARE: Discharge to home after PACU  PATIENT DISPOSITION:  PACU - hemodynamically stable.

## 2017-02-01 ENCOUNTER — Observation Stay (HOSPITAL_COMMUNITY)
Admission: EM | Admit: 2017-02-01 | Discharge: 2017-02-02 | Disposition: A | Discharge status: Home / Self Care | Payer: Medicare HMO | Attending: Internal Medicine | Admitting: Internal Medicine

## 2017-02-01 ENCOUNTER — Encounter (HOSPITAL_COMMUNITY): Payer: Self-pay | Admitting: Emergency Medicine

## 2017-02-01 DIAGNOSIS — M1990 Unspecified osteoarthritis, unspecified site: Secondary | ICD-10-CM | POA: Diagnosis present

## 2017-02-01 DIAGNOSIS — E876 Hypokalemia: Secondary | ICD-10-CM | POA: Diagnosis not present

## 2017-02-01 DIAGNOSIS — Z87891 Personal history of nicotine dependence: Secondary | ICD-10-CM | POA: Diagnosis not present

## 2017-02-01 DIAGNOSIS — R931 Abnormal findings on diagnostic imaging of heart and coronary circulation: Secondary | ICD-10-CM | POA: Diagnosis not present

## 2017-02-01 DIAGNOSIS — E785 Hyperlipidemia, unspecified: Secondary | ICD-10-CM | POA: Diagnosis present

## 2017-02-01 DIAGNOSIS — G4730 Sleep apnea, unspecified: Secondary | ICD-10-CM | POA: Diagnosis present

## 2017-02-01 DIAGNOSIS — Z951 Presence of aortocoronary bypass graft: Secondary | ICD-10-CM | POA: Diagnosis not present

## 2017-02-01 DIAGNOSIS — J189 Pneumonia, unspecified organism: Secondary | ICD-10-CM

## 2017-02-01 DIAGNOSIS — I252 Old myocardial infarction: Secondary | ICD-10-CM | POA: Insufficient documentation

## 2017-02-01 DIAGNOSIS — J45909 Unspecified asthma, uncomplicated: Secondary | ICD-10-CM | POA: Diagnosis not present

## 2017-02-01 DIAGNOSIS — I2510 Atherosclerotic heart disease of native coronary artery without angina pectoris: Secondary | ICD-10-CM | POA: Diagnosis not present

## 2017-02-01 DIAGNOSIS — I10 Essential (primary) hypertension: Secondary | ICD-10-CM | POA: Diagnosis present

## 2017-02-01 DIAGNOSIS — R0902 Hypoxemia: Secondary | ICD-10-CM

## 2017-02-01 DIAGNOSIS — I482 Chronic atrial fibrillation, unspecified: Secondary | ICD-10-CM | POA: Diagnosis present

## 2017-02-01 DIAGNOSIS — R799 Abnormal finding of blood chemistry, unspecified: Secondary | ICD-10-CM | POA: Diagnosis present

## 2017-02-01 HISTORY — DX: Hyperlipidemia, unspecified: E78.5

## 2017-02-01 LAB — CBC
HEMATOCRIT: 34.3 % — AB (ref 39.0–52.0)
HEMOGLOBIN: 12.2 g/dL — AB (ref 13.0–17.0)
MCH: 30.2 pg (ref 26.0–34.0)
MCHC: 35.6 g/dL (ref 30.0–36.0)
MCV: 84.9 fL (ref 78.0–100.0)
Platelets: 201 10*3/uL (ref 150–400)
RBC: 4.04 MIL/uL — ABNORMAL LOW (ref 4.22–5.81)
RDW: 13.9 % (ref 11.5–15.5)
WBC: 9.2 10*3/uL (ref 4.0–10.5)

## 2017-02-01 LAB — BASIC METABOLIC PANEL
ANION GAP: 9 (ref 5–15)
BUN: 11 mg/dL (ref 6–20)
CO2: 28 mmol/L (ref 22–32)
Calcium: 8.8 mg/dL — ABNORMAL LOW (ref 8.9–10.3)
Chloride: 104 mmol/L (ref 101–111)
Creatinine, Ser: 0.95 mg/dL (ref 0.61–1.24)
GFR calc Af Amer: >60 mL/min (ref >60)
GLUCOSE: 147 mg/dL — AB (ref 65–99)
POTASSIUM: 2.9 mmol/L — AB (ref 3.5–5.1)
Sodium: 141 mmol/L (ref 135–145)

## 2017-02-01 LAB — I-STAT TROPONIN, ED: Troponin i, poc: 0.06 ng/mL (ref 0.00–0.08)

## 2017-02-01 LAB — PHOSPHORUS: PHOSPHORUS: 3.2 mg/dL (ref 2.5–4.6)

## 2017-02-01 LAB — MAGNESIUM: Magnesium: 1.9 mg/dL (ref 1.7–2.4)

## 2017-02-01 MED ORDER — POTASSIUM CHLORIDE 10 MEQ/100ML IV SOLN
10.0000 meq | INTRAVENOUS | Status: AC
  Administered 2017-02-01 (×3): 10 meq via INTRAVENOUS
  Filled 2017-02-01 (×3): qty 100

## 2017-02-01 MED ORDER — FLUTICASONE PROPIONATE 50 MCG/ACT NA SUSP
2.0000 | Freq: Every day | NASAL | Status: DC
  Filled 2017-02-01: qty 16

## 2017-02-01 MED ORDER — BUDESONIDE 0.5 MG/2ML IN SUSP
0.5000 mg | Freq: Two times a day (BID) | RESPIRATORY_TRACT | Status: DC
Start: 2017-02-01 — End: 2017-02-02
  Administered 2017-02-02: 0.5 mg via RESPIRATORY_TRACT
  Filled 2017-02-01: qty 2

## 2017-02-01 MED ORDER — HEPARIN SODIUM (PORCINE) 5000 UNIT/ML IJ SOLN
5000.0000 [IU] | Freq: Three times a day (TID) | INTRAMUSCULAR | Status: DC
  Administered 2017-02-01 – 2017-02-02 (×3): 5000 [IU] via SUBCUTANEOUS
  Filled 2017-02-01 (×3): qty 1

## 2017-02-01 MED ORDER — OMEGA-3-ACID ETHYL ESTERS 1 G PO CAPS
1.0000 g | ORAL_CAPSULE | Freq: Every day | ORAL | Status: DC
  Administered 2017-02-02: 1 g via ORAL
  Filled 2017-02-01: qty 1

## 2017-02-01 MED ORDER — HYDROCODONE-ACETAMINOPHEN 7.5-325 MG PO TABS
1.0000 | ORAL_TABLET | ORAL | Status: DC | PRN
  Administered 2017-02-01: 1 via ORAL
  Filled 2017-02-01: qty 1

## 2017-02-01 MED ORDER — VITAMIN D 1000 UNITS PO TABS
1000.0000 [IU] | ORAL_TABLET | Freq: Every day | ORAL | Status: DC
  Administered 2017-02-02: 1000 [IU] via ORAL
  Filled 2017-02-01: qty 1

## 2017-02-01 MED ORDER — TRAZODONE HCL 50 MG PO TABS
50.0000 mg | ORAL_TABLET | Freq: Every day | ORAL | Status: DC
  Administered 2017-02-01: 50 mg via ORAL
  Filled 2017-02-01: qty 1

## 2017-02-01 MED ORDER — AMLODIPINE BESYLATE 10 MG PO TABS
10.0000 mg | ORAL_TABLET | Freq: Every day | ORAL | Status: DC
  Administered 2017-02-02: 10 mg via ORAL
  Filled 2017-02-01: qty 1

## 2017-02-01 MED ORDER — KCL IN DEXTROSE-NACL 40-5-0.45 MEQ/L-%-% IV SOLN
INTRAVENOUS | Status: DC
  Administered 2017-02-02: 06:00:00 via INTRAVENOUS
  Filled 2017-02-01: qty 1000

## 2017-02-01 MED ORDER — ASPIRIN EC 81 MG PO TBEC
81.0000 mg | DELAYED_RELEASE_TABLET | Freq: Every day | ORAL | Status: DC
  Administered 2017-02-02: 81 mg via ORAL
  Filled 2017-02-01: qty 1

## 2017-02-01 MED ORDER — POLYVINYL ALCOHOL 1.4 % OP SOLN
1.0000 [drp] | Freq: Every day | OPHTHALMIC | Status: DC
Start: 2017-02-02 — End: 2017-02-02
  Filled 2017-02-01: qty 15

## 2017-02-01 MED ORDER — SIMVASTATIN 20 MG PO TABS
40.0000 mg | ORAL_TABLET | Freq: Every day | ORAL | Status: DC
  Administered 2017-02-01: 40 mg via ORAL
  Filled 2017-02-01: qty 2

## 2017-02-01 MED ORDER — MOMETASONE FUROATE 220 MCG/INH IN AEPB: 2.0000 | INHALATION_SPRAY | Freq: Every day | RESPIRATORY_TRACT | Status: DC

## 2017-02-01 MED ORDER — SODIUM CHLORIDE 0.9 % IV SOLN: 30.0000 meq | Freq: Once | INTRAVENOUS | Status: DC

## 2017-02-01 MED ORDER — FUROSEMIDE 20 MG PO TABS
40.0000 mg | ORAL_TABLET | Freq: Every day | ORAL | Status: DC
  Administered 2017-02-02: 40 mg via ORAL
  Filled 2017-02-01: qty 2

## 2017-02-01 MED ORDER — DOCUSATE SODIUM 100 MG PO CAPS: 100.0000 mg | ORAL_CAPSULE | Freq: Two times a day (BID) | ORAL | Status: DC | PRN

## 2017-02-01 NOTE — ED Triage Notes (Addendum)
[facility]; told to come to hospital for potassium related to 2.7 level. Pt denies pain or symptoms.

## 2017-02-01 NOTE — H&P (Signed)
History and Physical    Austin Roberts ZOX:[id number] DOB: Mar 26, 1929 DOA: 02/01/2017  PCP: Aida Puffer, MD   Patient coming from: Home.  I have personally briefly reviewed patient's old medical records in [facility]  Chief Complaint: Low potassium level.  HPI: Austin Roberts is a 83 y.o. male with medical history significant of chronic atrial fibrillation (no longer on Coumadin/denies history of warfarin-induced bleeding), osteoarthritis of the spine and hands, asthma, coronary artery disease, hyperlipidemia, hypertension, sleep apnea not on CPAP who is being referred to the emergency department [facility] who asked him to come to the hospital for further evaluation after his potassium level was 2.7 mmol/L. He denies headache, sore throat, productive cough, chest pain, dyspnea, dizziness, PND, orthopnea, pitting edema lower extremities, abdominal pain, diarrhea, constipation, melena, hematochezia, dysuria, frequency or hematuria. He complains of an episode of palpitations yesterday at home, but denied having any other symptoms associated to it.  ED Course: The patient was started on a 30 mEq potassium replacement infusion in the emergency department. Rechecked potassium level was 2.9 mmol/L. His WBC 9.2, H&H 12.2 g/dL and platelets 811. His sodium 141, chloride 104 and bicarbonate 28 mmol/L. Magnesium and phosphorus were normal. His troponin level was normal, EKG show A. fib with VPCs, IVCD probable old anteroseptal infarct.  Review of Systems: As per HPI otherwise 10 point review of systems negative.    Past Medical History:  Diagnosis Date  . Arthritis    spine and hands   . Asthma   . Coronary artery disease   . Hyperlipidemia   . Hypertension   . Myocardial infarction   . Sleep apnea    not using CPAP- for 4 years     Past Surgical History:  Procedure Laterality Date  . Brain bleed    . BRAIN SURGERY    . CARDIAC SURGERY    . CORONARY ARTERY BYPASS GRAFT     . CYSTOSCOPY W/ URETERAL STENT REMOVAL Right 10/03/2015   Procedure: CYSTOSCOPY WITH RIGHT STENT REMOVAL, RETROGRADE WITH RIGHT STENT PLACEMENT;  Surgeon: Ihor Gully, MD;  Location: [facility];  Service: Urology;  Laterality: Right;  . KIDNEY STONE SURGERY    . TONSILLECTOMY       reports that he has quit smoking. He has never used smokeless tobacco. He reports that he does not drink alcohol or use drugs.  No Known Allergies  Family History  Problem Relation Age of Onset  . CAD Mother   . Lung cancer Father   . AAA (abdominal aortic aneurysm) Sister   . CAD Brother     Prior to Admission medications   Medication Sig Start Date End Date Taking? Authorizing Provider  amLODipine (NORVASC) 10 MG tablet Take 10 mg by mouth daily.    Yes Historical Provider, MD  aspirin EC 81 MG tablet Take 81 mg by mouth daily.   Yes Historical Provider, MD  cholecalciferol (VITAMIN D) 1000 units tablet Take 1,000 Units by mouth daily.   Yes Historical Provider, MD  docusate sodium (COLACE) 100 MG capsule Take 100 mg by mouth 2 (two) times daily as needed for mild constipation.   Yes Historical Provider, MD  fluticasone (FLONASE) 50 MCG/ACT nasal spray Place 2 sprays into both nostrils daily.   Yes Historical Provider, MD  furosemide (LASIX) 40 MG tablet Take 40 mg by mouth daily.   Yes Historical Provider, MD  HYDROcodone-acetaminophen (NORCO) 7.5-325 MG tablet Take 1-2 tablets by mouth every 4 (four) hours as needed  for moderate pain. Maximum dose per 24 hours - 8 pills 10/03/15  Yes Ihor Gully, MD  mometasone [facility]) 220 MCG/INH inhaler Inhale 2 puffs into the lungs daily.   Yes Historical Provider, MD  omega-3 acid ethyl esters (LOVAZA) 1 g capsule Take 1 g by mouth daily.   Yes Historical Provider, MD  Polyethyl Glycol-Propyl Glycol (SYSTANE) 0.4-0.3 % SOLN Place 1 drop into both eyes daily.   Yes Historical Provider, MD  simvastatin (ZOCOR) 40 MG tablet Take 40 mg by mouth at bedtime.    Yes  Historical Provider, MD  traZODone (DESYREL) 50 MG tablet Take 50 mg by mouth at bedtime.   Yes Historical Provider, MD    Physical Exam:  Constitutional: NAD, calm, comfortable Vitals:   02/01/17 1717 02/01/17 1946  BP: (!) 171/64 (!) 162/51  Pulse: 72 (!) 58  Resp: 18 18  Temp: 98.8 F (37.1 C)   TempSrc: Oral   SpO2: 92% 99%   Eyes: PERRL, lids and conjunctivae normal ENMT: Mucous membranes are moist. Posterior pharynx clear of any exudate or lesions.   Neck: normal, supple, no masses, no thyromegaly Respiratory: clear to auscultation bilaterally, no wheezing, no crackles. Normal respiratory effort. No accessory muscle use.  Cardiovascular: Irregularly irregular, positive 2/6 systolic ejection murmur, no rubs / gallops. No extremity edema. 2+ pedal pulses. No carotid bruits.  Abdomen: Soft, no tenderness, no masses palpated. No hepatosplenomegaly. Bowel sounds positive.  Musculoskeletal: no clubbing / cyanosis. No joint deformity upper and lower extremities. Good ROM, no contractures. Normal muscle tone.  Skin: Hyperpigmented macules on upper back. Neurologic: CN 2-12 grossly intact. Sensation intact, DTR normal. Strength 5/5 in all 4.  Psychiatric: Normal judgment and insight. Alert and oriented x 3. Normal mood.    Labs on Admission: I have personally reviewed following labs and imaging studies  CBC:  Recent Labs Lab 02/01/17 1748  WBC 9.2  HGB 12.2*  HCT 34.3*  MCV 84.9  PLT 201   Basic Metabolic Panel:  Recent Labs Lab 02/01/17 1748  NA 141  K 2.9*  CL 104  CO2 28  GLUCOSE 147*  BUN 11  CREATININE 0.95  CALCIUM 8.8*  MG 1.9  PHOS 3.2   GFR: CrCl cannot be calculated (Unknown ideal weight.). Liver Function Tests: No results for input(s): AST, ALT, ALKPHOS, BILITOT, PROT, ALBUMIN in the last 168 hours. No results for input(s): LIPASE, AMYLASE in the last 168 hours. No results for input(s): AMMONIA in the last 168 hours. Coagulation Profile: No  results for input(s): INR, PROTIME in the last 168 hours. Cardiac Enzymes: No results for input(s): CKTOTAL, CKMB, CKMBINDEX, TROPONINI in the last 168 hours. BNP (last 3 results) No results for input(s): PROBNP in the last 8760 hours. HbA1C: No results for input(s): HGBA1C in the last 72 hours. CBG: No results for input(s): GLUCAP in the last 168 hours. Lipid Profile: No results for input(s): CHOL, HDL, LDLCALC, TRIG, CHOLHDL, LDLDIRECT in the last 72 hours. Thyroid Function Tests: No results for input(s): TSH, T4TOTAL, FREET4, T3FREE, THYROIDAB in the last 72 hours. Anemia Panel: No results for input(s): VITAMINB12, FOLATE, FERRITIN, TIBC, IRON, RETICCTPCT in the last 72 hours. Urine analysis:    Component Value Date/Time   COLORURINE YELLOW 05/26/2015 1448   APPEARANCEUR CLOUDY (A) 05/26/2015 1448   LABSPEC 1.011 05/26/2015 1448   PHURINE 5.5 05/26/2015 1448   GLUCOSEU NEGATIVE 05/26/2015 1448   HGBUR LARGE (A) 05/26/2015 1448   BILIRUBINUR NEGATIVE 05/26/2015 1448   KETONESUR NEGATIVE  05/26/2015 1448   PROTEINUR 30 (A) 05/26/2015 1448   UROBILINOGEN 0.2 05/26/2015 1448   NITRITE NEGATIVE 05/26/2015 1448   LEUKOCYTESUR MODERATE (A) 05/26/2015 1448    Radiological Exams on Admission: No results found.  EKG: Independently reviewed. Vent. rate 70 BPM PR interval * ms QRS duration 145 ms QT/QTc 449/485 ms P-R-T axes * 203 29 Atrial fibrillation Ventricular premature complex IVCD, consider atypical RBBB Probable anteroseptal infarct, old  Assessment/Plan Principal Problem:   Hypokalemia Admit to telemetry/observation. Continue potassium replacement. Check magnesium level. Follow-up potassium level in a.m.  Active Problems:   Chronic atrial fibrillation (HCC) CHA2DS2-VASc Score of at least 4. Currently not on anticoagulation. Per patient, he used warfarin several years ago.    Hypertension   Coronary artery disease Continue aspirin and simvastatin.     Asthma Continue Asmanex.    Hyperlipidemia Continue simvastatin 40 mg po daily. Monitor LFTs and lipid panel.    Sleep apnea Declined using CPAP.    Arthritis Continue analgesics as needed.   DVT prophylaxis: Heparin SQ. Code Status: Full code. Family Communication:  Disposition Plan: Admit for potassium replacement and cardiac monitoring. Consults called:  Admission status: Observation/Telemetry.   Bobette Mo MD [facility] Pager [phone].  If 7PM-7AM, please contact night-coverage [email or web address] Password [email or web address]  02/01/2017, 9:30 PM

## 2017-02-01 NOTE — ED Provider Notes (Signed)
Emergency Department Provider Note   I have reviewed the triage vital signs and the nursing notes.   HISTORY  Chief Complaint Abnormal Lab   HPI Austin Roberts is a 83 y.o. male with PMH of asthma, HTN, AMI on lasix since to the emergency department for evaluation of hypokalemia. The patient was following up at the [facility] for blood work today when he was called on the drive home saying that his potassium was very low and he should present to the emergency department. Patient is otherwise feeling well but did note some fluttering sensation in his heart yesterday. He denies any diarrhea or vomiting. He has been taking his Lasix and was previously prescribed potassium supplementation but was told to stop taking it lately moved down here. No generalized weakness, numbness, tingling. No chest pain or difficulty breathing.   Past Medical History:  Diagnosis Date  . Arthritis    spine and hands   . Asthma   . Coronary artery disease   . Hyperlipidemia   . Hypertension   . Myocardial infarction (HCC)   . Sleep apnea    not using CPAP- for 4 years     Patient Active Problem List   Diagnosis Date Noted  . PNA (pneumonia) 02/02/2017  . Hypokalemia 02/01/2017  . Hypertension 02/01/2017  . Coronary artery disease 02/01/2017  . Asthma 02/01/2017  . Hyperlipidemia 02/01/2017  . Sleep apnea 02/01/2017  . Arthritis 02/01/2017  . Chronic atrial fibrillation (HCC) 02/01/2017    Past Surgical History:  Procedure Laterality Date  . Brain bleed    . BRAIN SURGERY    . CARDIAC SURGERY    . CORONARY ARTERY BYPASS GRAFT    . CYSTOSCOPY W/ URETERAL STENT REMOVAL Right 10/03/2015   Procedure: CYSTOSCOPY WITH RIGHT STENT REMOVAL, RETROGRADE WITH RIGHT STENT PLACEMENT;  Surgeon: Ihor Gully, MD;  Location: [facility];  Service: Urology;  Laterality: Right;  . KIDNEY STONE SURGERY    . TONSILLECTOMY      Current Outpatient Rx  . Order #: [id number] Class: Historical Med  . Order #:  [id number] Class: Historical Med  . Order #: [id number] Class: Historical Med  . Order #: [id number] Class: Historical Med  . Order #: [id number] Class: Historical Med  . Order #: [id number] Class: Historical Med  . Order #: [id number] Class: Print  . Order #: [id number] Class: Historical Med  . Order #: [id number] Class: Historical Med  . Order #: [id number] Class: Historical Med  . Order #: [id number] Class: Historical Med  . Order #: [id number] Class: Historical Med  . Order #: [id number] Class: Print  . Order #: [id number] Class: Print  . Order #: [id number] Class: Print    Allergies Patient has no known allergies.  Family History  Problem Relation Age of Onset  . CAD Mother   . Lung cancer Father   . AAA (abdominal aortic aneurysm) Sister   . CAD Brother     Social History Social History  Substance Use Topics  . Smoking status: Former Games developer  . Smokeless tobacco: Never Used  . Alcohol use No    Review of Systems  Constitutional: No fever/chills Eyes: No visual changes. ENT: No sore throat. Cardiovascular: Denies chest pain. Respiratory: Denies shortness of breath. Gastrointestinal: No abdominal pain.  No nausea, no vomiting.  No diarrhea.  No constipation. Genitourinary: Negative for dysuria. Musculoskeletal: Negative for back pain. Skin: Negative for rash. Neurological: Negative for headaches, focal weakness or numbness.  10-point ROS otherwise negative.  ____________________________________________   PHYSICAL EXAM:  VITAL SIGNS: ED Triage Vitals [02/01/17 1717]  Enc Vitals Group     BP (!) 171/64     Pulse Rate 72     Resp 18     Temp 98.8 F (37.1 C)     Temp Source Oral     SpO2 92 %   Constitutional: Alert and oriented. Well appearing and in no acute distress. Eyes: Conjunctivae are normal. Head: Atraumatic. Nose: No congestion/rhinnorhea. Mouth/Throat: Mucous membranes are moist.  Oropharynx non-erythematous. Neck: No stridor.  Cardiovascular: Normal rate,  regular rhythm. Good peripheral circulation. Grossly normal heart sounds.   Respiratory: Normal respiratory effort.  No retractions. Lungs CTAB. Gastrointestinal: Soft and nontender. No distention.  Musculoskeletal: No lower extremity tenderness nor edema. No gross deformities of extremities. Neurologic:  Normal speech and language. No gross focal neurologic deficits are appreciated.  Skin:  Skin is warm, dry and intact. No rash noted.  ____________________________________________   LABS (all labs ordered are listed, but only abnormal results are displayed)  Labs Reviewed  BASIC METABOLIC PANEL - Abnormal; Notable for the following:       Result Value   Potassium 2.9 (*)    Glucose, Bld 147 (*)    Calcium 8.8 (*)    All other components within normal limits  CBC - Abnormal; Notable for the following:    RBC 4.04 (*)    Hemoglobin 12.2 (*)    HCT 34.3 (*)    All other components within normal limits  CBC - Abnormal; Notable for the following:    RBC 3.86 (*)    Hemoglobin 11.1 (*)    HCT 32.2 (*)    All other components within normal limits  COMPREHENSIVE METABOLIC PANEL - Abnormal; Notable for the following:    Potassium 2.7 (*)    Glucose, Bld 168 (*)    Calcium 8.6 (*)    Total Protein 6.0 (*)    Albumin 3.1 (*)    AST 12 (*)    ALT 9 (*)    All other components within normal limits  BASIC METABOLIC PANEL - Abnormal; Notable for the following:    Glucose, Bld 208 (*)    Calcium 8.8 (*)    GFR calc non Af Amer 56 (*)    All other components within normal limits  MAGNESIUM  PHOSPHORUS  I-STAT TROPOININ, ED   ____________________________________________  EKG   EKG Interpretation  Date/Time:  Friday February 01 2017 17:28:40 EDT Ventricular Rate:  70 PR Interval:    QRS Duration: 145 QT Interval:  449 QTC Calculation: 485 R Axis:   -157 Text Interpretation:  Atrial fibrillation Ventricular premature complex IVCD, consider atypical RBBB Probable anteroseptal  infarct, old No old tracing for comparison. No STEMI.  Confirmed by LONG MD, JOSHUA [phone]) on 02/01/2017 5:57:21 PM Also confirmed by LONG MD, JOSHUA [phone]), editor Misty Stanley [phone])  on 02/02/2017 9:31:52 AM      ____________________________________________   PROCEDURES  Procedure(s) performed:   Procedures  None ____________________________________________   INITIAL IMPRESSION / ASSESSMENT AND PLAN / ED COURSE  Pertinent labs & imaging results that were available during my care of the patient were reviewed by me and considered in my medical decision making (see chart for details).  Patient resents to the emergency department for evaluation of hyopkalemia. Patient had some very mild fluttering sensation in his chest yesterday but otherwise is asymptomatic. He is on Lasix and not taking his potassium supple mentation because he was told stop. The patient's EKG from today shows some evidence of hypokalemia changes  including ST segment changes and U wave. No old tracing for comparison. Will replete K here in the ED and admit for hypokalemia with EKG changes.   Discussed patient's case with Hospitalist. Patient and family (if present) updated with plan. Care transferred to hospitalist service.  I reviewed all nursing notes, vitals, pertinent old records, EKGs, labs, imaging (as available).  ____________________________________________  FINAL CLINICAL IMPRESSION(S) / ED DIAGNOSES  Final diagnoses:  Hypokalemia     MEDICATIONS GIVEN DURING THIS VISIT:  Medications  potassium chloride 10 mEq in 100 mL IVPB (10 mEq Intravenous New Bag/Given 02/01/17 2200)  potassium chloride SA (K-DUR,KLOR-CON) CR tablet 40 mEq (40 mEq Oral Given 02/02/17 1246)  potassium chloride 10 mEq in 100 mL IVPB (10 mEq Intravenous Given 02/02/17 1242)  levofloxacin (LEVAQUIN) IVPB 750 mg (750 mg Intravenous Given 02/02/17 1248)     NEW OUTPATIENT MEDICATIONS STARTED DURING THIS  VISIT:  Discharge Medication List as of 02/02/2017  4:20 PM    START taking these medications   Details  guaiFENesin (MUCINEX) 600 MG 12 hr tablet Take 2 tablets (1,200 mg total) by mouth 2 (two) times daily., Starting Sat 02/02/2017, Print    levofloxacin (LEVAQUIN) 500 MG tablet Take 1 tablet (500 mg total) by mouth daily., Starting Sun 02/03/2017, Until Thu 02/07/2017, Print    potassium chloride (K-DUR) 10 MEQ tablet Please take 40 meq you oral daily for 3 days, then 20 ameq oral daily for 4 days then stop., Print          Note:  This document was prepared using Dragon voice recognition software and may include unintentional dictation errors.  Alona Bene, MD Emergency Medicine   Maia Plan, MD 02/04/17 [phone]

## 2017-02-02 ENCOUNTER — Observation Stay (HOSPITAL_COMMUNITY): Payer: Medicare HMO

## 2017-02-02 DIAGNOSIS — I482 Chronic atrial fibrillation: Secondary | ICD-10-CM

## 2017-02-02 DIAGNOSIS — J189 Pneumonia, unspecified organism: Secondary | ICD-10-CM

## 2017-02-02 DIAGNOSIS — E876 Hypokalemia: Secondary | ICD-10-CM | POA: Diagnosis not present

## 2017-02-02 LAB — CBC
HCT: 32.2 % — ABNORMAL LOW (ref 39.0–52.0)
Hemoglobin: 11.1 g/dL — ABNORMAL LOW (ref 13.0–17.0)
MCH: 28.8 pg (ref 26.0–34.0)
MCHC: 34.5 g/dL (ref 30.0–36.0)
MCV: 83.4 fL (ref 78.0–100.0)
Platelets: 183 10*3/uL (ref 150–400)
RBC: 3.86 MIL/uL — ABNORMAL LOW (ref 4.22–5.81)
RDW: 14.1 % (ref 11.5–15.5)
WBC: 8.5 10*3/uL (ref 4.0–10.5)

## 2017-02-02 LAB — COMPREHENSIVE METABOLIC PANEL
ALT: 9 U/L — ABNORMAL LOW (ref 17–63)
AST: 12 U/L — ABNORMAL LOW (ref 15–41)
Albumin: 3.1 g/dL — ABNORMAL LOW (ref 3.5–5.0)
Alkaline Phosphatase: 76 U/L (ref 38–126)
Anion gap: 8 (ref 5–15)
BUN: 10 mg/dL (ref 6–20)
CALCIUM: 8.6 mg/dL — AB (ref 8.9–10.3)
CHLORIDE: 106 mmol/L (ref 101–111)
CO2: 28 mmol/L (ref 22–32)
CREATININE: 0.95 mg/dL (ref 0.61–1.24)
Glucose, Bld: 168 mg/dL — ABNORMAL HIGH (ref 65–99)
POTASSIUM: 2.7 mmol/L — AB (ref 3.5–5.1)
Sodium: 142 mmol/L (ref 135–145)
TOTAL PROTEIN: 6 g/dL — AB (ref 6.5–8.1)
Total Bilirubin: 1.2 mg/dL (ref 0.3–1.2)

## 2017-02-02 LAB — BASIC METABOLIC PANEL
Anion gap: 8 (ref 5–15)
BUN: 8 mg/dL (ref 6–20)
CALCIUM: 8.8 mg/dL — AB (ref 8.9–10.3)
CO2: 28 mmol/L (ref 22–32)
Chloride: 104 mmol/L (ref 101–111)
Creatinine, Ser: 1.16 mg/dL (ref 0.61–1.24)
GFR calc Af Amer: >60 mL/min (ref >60)
GFR, EST NON AFRICAN AMERICAN: 56 mL/min — AB (ref >60)
GLUCOSE: 208 mg/dL — AB (ref 65–99)
POTASSIUM: 3.8 mmol/L (ref 3.5–5.1)
SODIUM: 140 mmol/L (ref 135–145)

## 2017-02-02 MED ORDER — GUAIFENESIN ER 600 MG PO TB12
1200.0000 mg | ORAL_TABLET | Freq: Two times a day (BID) | ORAL | Status: DC
  Administered 2017-02-02: 1200 mg via ORAL
  Filled 2017-02-02: qty 2

## 2017-02-02 MED ORDER — LEVOFLOXACIN 500 MG PO TABS: 500.0000 mg | ORAL_TABLET | Freq: Every day | ORAL | 0 refills | Status: AC

## 2017-02-02 MED ORDER — POTASSIUM CHLORIDE ER 10 MEQ PO TBCR: EXTENDED_RELEASE_TABLET | ORAL | 0 refills | Status: AC

## 2017-02-02 MED ORDER — POTASSIUM CHLORIDE CRYS ER 20 MEQ PO TBCR
40.0000 meq | EXTENDED_RELEASE_TABLET | ORAL | Status: AC
  Administered 2017-02-02 (×2): 40 meq via ORAL
  Filled 2017-02-02 (×2): qty 2

## 2017-02-02 MED ORDER — LEVOFLOXACIN IN D5W 500 MG/100ML IV SOLN: 500.0000 mg | INTRAVENOUS | Status: DC

## 2017-02-02 MED ORDER — GUAIFENESIN ER 600 MG PO TB12: 1200.0000 mg | ORAL_TABLET | Freq: Two times a day (BID) | ORAL | 0 refills | Status: AC

## 2017-02-02 MED ORDER — POTASSIUM CHLORIDE 10 MEQ/100ML IV SOLN
10.0000 meq | INTRAVENOUS | Status: AC
  Administered 2017-02-02 (×4): 10 meq via INTRAVENOUS
  Filled 2017-02-02 (×4): qty 100

## 2017-02-02 MED ORDER — LEVOFLOXACIN IN D5W 750 MG/150ML IV SOLN
750.0000 mg | Freq: Once | INTRAVENOUS | Status: AC
  Administered 2017-02-02: 750 mg via INTRAVENOUS
  Filled 2017-02-02: qty 150

## 2017-02-02 NOTE — Progress Notes (Signed)
Pharmacy Antibiotic Note  Austin Roberts is a 83 y.o. male admitted on 02/01/2017 with pneumonia.  Pharmacy has been consulted for levofloxacin dosing. CT angio reveals consolidation that is suspicious for infiltrate/pneumonia.   Plan:  Levofloxacin  IV q24h for CAP  Suggest treat x 5 days  Height:  (177.8 cm) Weight: 180 lb 1.6 oz (81.7 kg) IBW/kg (Calculated) : 73  Temp (24hrs), Avg:98.8 F (37.1 C), Min:98.2 F (36.8 C), Max:99.3 F (37.4 C)   Recent Labs Lab 02/01/17 1748 02/02/17 0550  WBC 9.2 8.5  CREATININE 0.95 0.95    Estimated Creatinine Clearance: 58.7 mL/min (by C-G formula based on SCr of 0.95 mg/dL).    No Known Allergies  Antimicrobials this admission: 4/14 levofloxacin >>  Dose adjustments this admission:  Microbiology results: none  Thank you for allowing pharmacy to be a part of this patient's care.  Juliette Alcide, PharmD, BCPS.   Pager: [phone] 02/02/2017 11:59 AM

## 2017-02-02 NOTE — Discharge Instructions (Signed)
Follow with Primary MD Aida Puffer, MD in 7 days   Get CBC, CMP, 2 view Chest X ray checked  by Primary MD next visit.    Activity: As tolerated with Full fall precautions use walker/cane & assistance as needed   Disposition Home    Diet: Heart Healthy , with feeding assistance and aspiration precautions.  For Heart failure patients - Check your Weight same time everyday, if you gain over 2 pounds, or you develop in leg swelling, experience more shortness of breath or chest pain, call your Primary MD immediately. Follow Cardiac Low Salt Diet and 1.5 lit/day fluid restriction.   On your next visit with your primary care physician please Get Medicines reviewed and adjusted.   Please request your Prim.MD to go over all Hospital Tests and Procedure/Radiological results at the follow up, please get all Hospital records sent to your Prim MD by signing hospital release before you go home.   If you experience worsening of your admission symptoms, develop shortness of breath, life threatening emergency, suicidal or homicidal thoughts you must seek medical attention immediately by calling 911 or calling your MD immediately  if symptoms less severe.  You Must read complete instructions/literature along with all the possible adverse reactions/side effects for all the Medicines you take and that have been prescribed to you. Take any new Medicines after you have completely understood and accpet all the possible adverse reactions/side effects.   Do not drive, operating heavy machinery, perform activities at heights, swimming or participation in water activities or provide baby sitting services if your were admitted for syncope or siezures until you have seen by Primary MD or a Neurologist and advised to do so again.  Do not drive when taking Pain medications.    Do not take more than prescribed Pain, Sleep and Anxiety Medications  Special Instructions: If you have smoked or chewed Tobacco  in the  last 2 yrs please stop smoking, stop any regular Alcohol  and or any Recreational drug use.  Wear Seat belts while driving.   Please note  You were cared for by a hospitalist during your hospital stay. If you have any questions about your discharge medications or the care you received while you were in the hospital after you are discharged, you can call the unit and asked to speak with the hospitalist on call if the hospitalist that took care of you is not available. Once you are discharged, your primary care physician will handle any further medical issues. Please note that NO REFILLS for any discharge medications will be authorized once you are discharged, as it is imperative that you return to your primary care physician (or establish a relationship with a primary care physician if you do not have one) for your aftercare needs so that they can reassess your need for medications and monitor your lab values.

## 2017-02-02 NOTE — Discharge Summary (Addendum)
Austin Roberts, is a 83 y.o. male  DOB [birth year]  MRN [id number].  Admission date:  02/01/2017  Admitting Physician  Bobette Mo, Roberts  Discharge Date:  02/02/2017   Primary Roberts  Aida Puffer, Roberts  Recommendations for primary care physician for things to follow:  - please check CMP during next visit, and adjust potassium supplement as needed. - Repeat 2 view chest x-ray in 3-4 weeks to ensure  resolution of pneumonia.   Admission Diagnosis  Hypokalemia [E87.6]   Discharge Diagnosis  Hypokalemia [E87.6]   Principal Problem:   Hypokalemia Active Problems:   Hypertension   Coronary artery disease   Asthma   Hyperlipidemia   Sleep apnea   Arthritis   Chronic atrial fibrillation [facility])      Past Medical History:  Diagnosis Date  . Arthritis    spine and hands   . Asthma   . Coronary artery disease   . Hyperlipidemia   . Hypertension   . Myocardial infarction (HCC)   . Sleep apnea    not using CPAP- for 4 years     Past Surgical History:  Procedure Laterality Date  . Brain bleed    . BRAIN SURGERY    . CARDIAC SURGERY    . CORONARY ARTERY BYPASS GRAFT    . CYSTOSCOPY W/ URETERAL STENT REMOVAL Right 10/03/2015   Procedure: CYSTOSCOPY WITH RIGHT STENT REMOVAL, RETROGRADE WITH RIGHT STENT PLACEMENT;  Surgeon: Ihor Gully, Roberts;  Location: [facility] ORS;  Service: Urology;  Laterality: Right;  . KIDNEY STONE SURGERY    . TONSILLECTOMY         History of present illness and  Hospital Course:     Kindly see H&P for history of present illness and admission details, please review complete Labs, Consult reports and Test reports for all details in brief  HPI  from the history and physical done on the day of admission 02/01/2017  HPI: Austin Roberts is a 83 y.o. male with medical history significant of chronic atrial fibrillation (no longer on Coumadin/denies history of warfarin-induced  bleeding), osteoarthritis of the spine and hands, asthma, coronary artery disease, hyperlipidemia, hypertension, sleep apnea not on CPAP who is being referred to the emergency department by [facility] who asked him to come to the hospital for further evaluation after his potassium level was 2.7 mmol/L. He denies headache, sore throat, productive cough, chest pain, dyspnea, dizziness, PND, orthopnea, pitting edema lower extremities, abdominal pain, diarrhea, constipation, melena, hematochezia, dysuria, frequency or hematuria. He complains of an episode of palpitations yesterday at home, but denied having any other symptoms associated to it.  ED Course: The patient was started on a 30 mEq potassium replacement infusion in the emergency department. Rechecked potassium level was 2.9 mmol/L. His WBC 9.2, H&H 12.2 g/dL and platelets 409. His sodium 141, chloride 104 and bicarbonate 28 mmol/L. Magnesium and phosphorus were normal. His troponin level was normal, EKG show A. fib with VPCs, IVCD probable old anteroseptal infarct.  Hospital Course   Hypokalemia -  Patient on chronic Lasix, admitted with potassium was 2.7, was repleted, potassium was 3.8 on discharge, he will be discharged on supplements for 1 week, then to repeat BMP at PCP to determine if patient needs to continue with supplements.  Left lower lobe community-acquired Pneumonia - Patient reports dyspnea on exertion, chest x-ray suspicious for pneumonia, so CT chest was obtained which confirmed pneumonia, so he was started on levofloxacin, to finish total of 5 days course, and to repeat 2 view chest x-ray in 3-4 weeks to ensure resolution.  Chronic atrial fibrillation (HCC) CHA2DS2-VASc Score of at least 4, patient does not appear to be in any anticoagulation, reports he used to be more from the past which has stopped, patient instructed to follow-up with his PCP/primary cardiologist regarding further recommendation of anticoagulation were  they are familiar with this condition.  Hypertension/Coronary artery disease - Continue aspirin and simvastatin.    Asthma Continue Asmanex.    Hyperlipidemia Continue simvastatin 40 mg po daily.     Sleep apnea Declined using CPAP.    Arthritis Continue analgesics as needed.     Discharge Condition:  Stable Discussed with daughter at bedside   Follow UP  Follow-up Information    Austin Roberts Follow up in 1 week(s).   Specialty:  Family Medicine Contact information: [address] [place] [address] [phone]             Discharge Instructions  and  Discharge Medications     Discharge Instructions    Diet - low sodium heart healthy    Complete by:  As directed    Discharge instructions    Complete by:  As directed    Follow with Primary Roberts Aida Puffer, Roberts in 7 days   Get CBC, CMP, 2 view Chest X ray checked  by Primary Roberts next visit.    Activity: As tolerated with Full fall precautions use walker/cane & assistance as needed   Disposition Home    Diet: Heart Healthy , with feeding assistance and aspiration precautions.  For Heart failure patients - Check your Weight same time everyday, if you gain over 2 pounds, or you develop in leg swelling, experience more shortness of breath or chest pain, call your Primary Roberts immediately. Follow Cardiac Low Salt Diet and 1.5 lit/day fluid restriction.   On your next visit with your primary care physician please Get Medicines reviewed and adjusted.   Please request your Prim.Roberts to go over all Hospital Tests and Procedure/Radiological results at the follow up, please get all Hospital records sent to your Prim Roberts by signing hospital release before you go home.   If you experience worsening of your admission symptoms, develop shortness of breath, life threatening emergency, suicidal or homicidal thoughts you must seek medical attention immediately by calling 911 or calling your Roberts immediately  if  symptoms less severe.  You Must read complete instructions/literature along with all the possible adverse reactions/side effects for all the Medicines you take and that have been prescribed to you. Take any new Medicines after you have completely understood and accpet all the possible adverse reactions/side effects.   Do not drive, operating heavy machinery, perform activities at heights, swimming or participation in water activities or provide baby sitting services if your were admitted for syncope or siezures until you have seen by Primary Roberts or a Neurologist and advised to do so again.  Do not drive when taking Pain medications.    Do not take more than prescribed  Pain, Sleep and Anxiety Medications  Special Instructions: If you have smoked or chewed Tobacco  in the last 2 yrs please stop smoking, stop any regular Alcohol  and or any Recreational drug use.  Wear Seat belts while driving.   Please note  You were cared for by a hospitalist during your hospital stay. If you have any questions about your discharge medications or the care you received while you were in the hospital after you are discharged, you can call the unit and asked to speak with the hospitalist on call if the hospitalist that took care of you is not available. Once you are discharged, your primary care physician will handle any further medical issues. Please note that NO REFILLS for any discharge medications will be authorized once you are discharged, as it is imperative that you return to your primary care physician (or establish a relationship with a primary care physician if you do not have one) for your aftercare needs so that they can reassess your need for medications and monitor your lab values.   Increase activity slowly    Complete by:  As directed      Allergies as of 02/02/2017   No Known Allergies     Medication List    TAKE these medications   amLODipine 10 MG tablet Commonly known as:  NORVASC Take  10 mg by mouth daily.   aspirin EC 81 MG tablet Take 81 mg by mouth daily.   cholecalciferol 1000 units tablet Commonly known as:  VITAMIN D Take 1,000 Units by mouth daily.   docusate sodium 100 MG capsule Commonly known as:  COLACE Take 100 mg by mouth 2 (two) times daily as needed for mild constipation.   fluticasone 50 MCG/ACT nasal spray Commonly known as:  FLONASE Place 2 sprays into both nostrils daily.   furosemide 40 MG tablet Commonly known as:  LASIX Take 40 mg by mouth daily.   guaiFENesin 600 MG 12 hr tablet Commonly known as:  MUCINEX Take 2 tablets (1,200 mg total) by mouth 2 (two) times daily.   HYDROcodone-acetaminophen 7.5-325 MG tablet Commonly known as:  NORCO Take 1-2 tablets by mouth every 4 (four) hours as needed for moderate pain. Maximum dose per 24 hours - 8 pills   levofloxacin 500 MG tablet Commonly known as:  LEVAQUIN Take 1 tablet (500 mg total) by mouth daily. Start taking on:  02/03/2017   mometasone 220 MCG/INH inhaler Commonly known as:  ASMANEX Inhale 2 puffs into the lungs daily.   omega-3 acid ethyl esters 1 g capsule Commonly known as:  LOVAZA Take 1 g by mouth daily.   potassium chloride 10 MEQ tablet Commonly known as:  K-DUR Please take 40 meq you oral daily for 3 days, then 20 ameq oral daily for 4 days then stop.   simvastatin 40 MG tablet Commonly known as:  ZOCOR Take 40 mg by mouth at bedtime.   SYSTANE 0.4-0.3 % Soln Generic drug:  Polyethyl Glycol-Propyl Glycol Place 1 drop into both eyes daily.   traZODone 50 MG tablet Commonly known as:  DESYREL Take 50 mg by mouth at bedtime.         Diet and Activity recommendation: See Discharge Instructions above   Consults obtained -  none   Major procedures and Radiology Reports - PLEASE review detailed and final reports for all details, in brief -      Dg Chest 2 View  Result Date: 02/02/2017 CLINICAL DATA:  New onset  hypoxia this am. Pt hx afib,  asthma, coronary artery disease, hyperlipidemia, hypertension, sleep apnea. Admitted yesterday for increased potassium level. EXAM: CHEST  2 VIEW COMPARISON:  None. FINDINGS: Sternotomy wires overlie stable cardiac silhouette. There is a moderate LEFT effusion. Airspace disease versus atelectasis in the LEFT lower lobe. Prior no pneumothorax. IMPRESSION: 1. LEFT lower lobe atelectasis versus infiltrate. 2. Unilateral LEFT effusion. Favor parapneumonic effusion however cannot exclude malignancy. 3. Followup PA and lateral chest X-ray is recommended in 3-4 weeks following trial of antibiotic therapy to ensure resolution and exclude underlying malignancy. Electronically Signed   By: Genevive Bi M.D.   On: 02/02/2017 09:37   Ct Chest Wo Contrast  Result Date: 02/02/2017 CLINICAL DATA:  Left lower lobe atelectasis or infiltrate EXAM: CT CHEST WITHOUT CONTRAST TECHNIQUE: Multidetector CT imaging of the chest was performed following the standard protocol without IV contrast. COMPARISON:  Chest x-ray 02/02/2017 FINDINGS: Cardiovascular: Cardiomegaly is noted. Extensive atherosclerotic calcifications of coronary arteries. Atherosclerotic calcifications of thoracic aorta. Mediastinum/Nodes: Calcified right hilar lymph node Measures 1.4 cm probable due to prior granulomatous disease. Partially calcified precarinal lymph node measures 1.4 cm AP left mediastinal lymph node measures 1.4 cm short-axis. This might be reactive. Lungs/Pleura: There is small to moderate right pleural effusion. Atelectasis noted in right lower lobe posterior medially. There is small partially loculated left pleural effusion. There is consolidation with air bronchogram in left lower lobe posterior medially highly suspicious for infiltrate/pneumonia. Additional small infiltrate noted in the lingula posteriorly axial image 95. Findings suspicious for pneumonia. No pulmonary edema. There is some fluid or thickening along the left major fissure.  Upper Abdomen: The visualized upper abdomen shows no adrenal gland mass. Visualized liver and spleen is unremarkable. There is mild atrophic pancreas. Extensive atherosclerotic calcifications of abdominal aorta and SMA. Atherosclerotic calcifications of splenic artery. Musculoskeletal: No destructive bony lesions are noted. There are degenerative changes thoracic spine. Multilevel anterior bridging osteophytes. Moderate anterior osteophytes are noted lower thoracic spine. The patient is status post median sternotomy. Probable old right posterior rib fracture deformity axial image 79 and 67. IMPRESSION: 1. Cardiomegaly is noted. Extensive atherosclerotic calcifications of coronary arteries. Atherosclerotic calcifications of thoracic aorta and abdominal aorta. 2. There is small to moderate right pleural effusion. Dependent atelectasis noted in right lower lobe posteriorly. There is small partial loculated left pleural effusion. There is consolidation with air bronchogram in left lower lobe posterior/medial highly suspicious for infiltrate/pneumonia. Additional small focal consolidation is noted in lingula posteriorly. Findings suspicious for pneumonia. 3. No pulmonary edema. There are calcified mediastinal and right hilar lymph nodes mild enlarged probable due to prior granulomatous disease. Mild enlarged left mediastinal lymph nodes probable reactive. Central airways are patent. 4. Osteopenia and degenerative changes Electronically Signed   By: Natasha Mead M.D.   On: 02/02/2017 11:34    Micro Results     No results found for this or any previous visit (from the past 240 hour(s)).     Today   Subjective:   Jacqlyn Larsen today has no headache,no chest or abdominal pain, feels much better wants to go home today.   Objective:   Blood pressure 137/75, pulse 74, temperature 100 F (37.8 C), temperature source Oral, resp. rate 18, height  (1.778 m), weight 81.7 kg (180 lb 1.6 oz), SpO2 94  %.   Intake/Output Summary (Last 24 hours) at 02/02/17 1615 Last data filed at 02/02/17 1300  Gross per 24 hour  Intake  1440 ml  Output              450 ml  Net              990 ml    Exam Awake Alert, Oriented x 3, No new F.N deficits, Normal affect Supple Neck,No JVD,  Symmetrical Chest wall movement, Good air movement bilaterally, no wheezing  irr irr ,No Gallops,Rubs or new Murmurs, No Parasternal Heave +ve B.Sounds, Abd Soft, Non tender,  No rebound -guarding or rigidity. No Cyanosis, Clubbing or edema, No new Rash or bruise  Data Review   CBC w Diff: Lab Results  Component Value Date   WBC 8.5 02/02/2017   HGB 11.1 (L) 02/02/2017   HCT 32.2 (L) 02/02/2017   PLT 183 02/02/2017   LYMPHOPCT 13 05/26/2015   MONOPCT 12 05/26/2015   EOSPCT 2 05/26/2015   BASOPCT 0 05/26/2015    CMP: Lab Results  Component Value Date   NA 140 02/02/2017   K 3.8 02/02/2017   CL 104 02/02/2017   CO2 28 02/02/2017   BUN 8 02/02/2017   CREATININE 1.16 02/02/2017   PROT 6.0 (L) 02/02/2017   ALBUMIN 3.1 (L) 02/02/2017   BILITOT 1.2 02/02/2017   ALKPHOS 76 02/02/2017   AST 12 (L) 02/02/2017   ALT 9 (L) 02/02/2017  .   Total Time in preparing paper work, data evaluation and todays exam - 35 minutes  Shailene Demonbreun M.D on 02/02/2017 at 4:15 PM  [facility]   Office  [phone]

## 2020-04-24 ENCOUNTER — Inpatient Hospital Stay (HOSPITAL_COMMUNITY): Payer: No Typology Code available for payment source

## 2020-04-24 ENCOUNTER — Encounter (HOSPITAL_COMMUNITY): Payer: Self-pay | Admitting: Internal Medicine

## 2020-04-24 ENCOUNTER — Inpatient Hospital Stay (HOSPITAL_COMMUNITY)
Admission: AD | Admit: 2020-04-24 | Discharge: 2020-05-22 | DRG: 291 | Disposition: E | Payer: No Typology Code available for payment source | Source: Other Acute Inpatient Hospital | Attending: Internal Medicine | Admitting: Internal Medicine

## 2020-04-24 DIAGNOSIS — I25119 Atherosclerotic heart disease of native coronary artery with unspecified angina pectoris: Secondary | ICD-10-CM | POA: Diagnosis not present

## 2020-04-24 DIAGNOSIS — E43 Unspecified severe protein-calorie malnutrition: Secondary | ICD-10-CM | POA: Diagnosis present

## 2020-04-24 DIAGNOSIS — N1831 Chronic kidney disease, stage 3a: Secondary | ICD-10-CM | POA: Diagnosis present

## 2020-04-24 DIAGNOSIS — W19XXXA Unspecified fall, initial encounter: Secondary | ICD-10-CM | POA: Diagnosis present

## 2020-04-24 DIAGNOSIS — Z9181 History of falling: Secondary | ICD-10-CM

## 2020-04-24 DIAGNOSIS — F0390 Unspecified dementia without behavioral disturbance: Secondary | ICD-10-CM | POA: Diagnosis present

## 2020-04-24 DIAGNOSIS — I350 Nonrheumatic aortic (valve) stenosis: Secondary | ICD-10-CM

## 2020-04-24 DIAGNOSIS — I4821 Permanent atrial fibrillation: Secondary | ICD-10-CM | POA: Diagnosis present

## 2020-04-24 DIAGNOSIS — I493 Ventricular premature depolarization: Secondary | ICD-10-CM | POA: Diagnosis present

## 2020-04-24 DIAGNOSIS — G4733 Obstructive sleep apnea (adult) (pediatric): Secondary | ICD-10-CM | POA: Diagnosis present

## 2020-04-24 DIAGNOSIS — N179 Acute kidney failure, unspecified: Secondary | ICD-10-CM | POA: Diagnosis present

## 2020-04-24 DIAGNOSIS — Z8679 Personal history of other diseases of the circulatory system: Secondary | ICD-10-CM | POA: Diagnosis not present

## 2020-04-24 DIAGNOSIS — R64 Cachexia: Secondary | ICD-10-CM | POA: Diagnosis present

## 2020-04-24 DIAGNOSIS — I472 Ventricular tachycardia: Secondary | ICD-10-CM | POA: Diagnosis present

## 2020-04-24 DIAGNOSIS — R011 Cardiac murmur, unspecified: Secondary | ICD-10-CM | POA: Diagnosis not present

## 2020-04-24 DIAGNOSIS — E785 Hyperlipidemia, unspecified: Secondary | ICD-10-CM | POA: Diagnosis present

## 2020-04-24 DIAGNOSIS — R000 Tachycardia, unspecified: Secondary | ICD-10-CM | POA: Diagnosis not present

## 2020-04-24 DIAGNOSIS — I5023 Acute on chronic systolic (congestive) heart failure: Secondary | ICD-10-CM | POA: Diagnosis present

## 2020-04-24 DIAGNOSIS — I252 Old myocardial infarction: Secondary | ICD-10-CM

## 2020-04-24 DIAGNOSIS — Z7189 Other specified counseling: Secondary | ICD-10-CM | POA: Diagnosis not present

## 2020-04-24 DIAGNOSIS — R634 Abnormal weight loss: Secondary | ICD-10-CM | POA: Diagnosis present

## 2020-04-24 DIAGNOSIS — R0600 Dyspnea, unspecified: Secondary | ICD-10-CM | POA: Diagnosis not present

## 2020-04-24 DIAGNOSIS — I509 Heart failure, unspecified: Secondary | ICD-10-CM

## 2020-04-24 DIAGNOSIS — I10 Essential (primary) hypertension: Secondary | ICD-10-CM | POA: Diagnosis not present

## 2020-04-24 DIAGNOSIS — R079 Chest pain, unspecified: Secondary | ICD-10-CM | POA: Diagnosis not present

## 2020-04-24 DIAGNOSIS — J449 Chronic obstructive pulmonary disease, unspecified: Secondary | ICD-10-CM | POA: Diagnosis present

## 2020-04-24 DIAGNOSIS — Z87442 Personal history of urinary calculi: Secondary | ICD-10-CM | POA: Diagnosis not present

## 2020-04-24 DIAGNOSIS — Z951 Presence of aortocoronary bypass graft: Secondary | ICD-10-CM | POA: Diagnosis not present

## 2020-04-24 DIAGNOSIS — Z87891 Personal history of nicotine dependence: Secondary | ICD-10-CM

## 2020-04-24 DIAGNOSIS — I2510 Atherosclerotic heart disease of native coronary artery without angina pectoris: Secondary | ICD-10-CM | POA: Diagnosis present

## 2020-04-24 DIAGNOSIS — I130 Hypertensive heart and chronic kidney disease with heart failure and stage 1 through stage 4 chronic kidney disease, or unspecified chronic kidney disease: Secondary | ICD-10-CM | POA: Diagnosis present

## 2020-04-24 DIAGNOSIS — Z66 Do not resuscitate: Secondary | ICD-10-CM | POA: Diagnosis present

## 2020-04-24 DIAGNOSIS — D631 Anemia in chronic kidney disease: Secondary | ICD-10-CM | POA: Diagnosis present

## 2020-04-24 DIAGNOSIS — Z7982 Long term (current) use of aspirin: Secondary | ICD-10-CM

## 2020-04-24 DIAGNOSIS — Z79899 Other long term (current) drug therapy: Secondary | ICD-10-CM

## 2020-04-24 DIAGNOSIS — R627 Adult failure to thrive: Secondary | ICD-10-CM | POA: Diagnosis present

## 2020-04-24 DIAGNOSIS — Y92009 Unspecified place in unspecified non-institutional (private) residence as the place of occurrence of the external cause: Secondary | ICD-10-CM | POA: Diagnosis not present

## 2020-04-24 DIAGNOSIS — R410 Disorientation, unspecified: Secondary | ICD-10-CM | POA: Diagnosis not present

## 2020-04-24 DIAGNOSIS — Z8249 Family history of ischemic heart disease and other diseases of the circulatory system: Secondary | ICD-10-CM

## 2020-04-24 DIAGNOSIS — Z515 Encounter for palliative care: Secondary | ICD-10-CM | POA: Diagnosis not present

## 2020-04-24 DIAGNOSIS — I5021 Acute systolic (congestive) heart failure: Secondary | ICD-10-CM | POA: Diagnosis not present

## 2020-04-24 DIAGNOSIS — Z681 Body mass index (BMI) 19 or less, adult: Secondary | ICD-10-CM

## 2020-04-24 DIAGNOSIS — I083 Combined rheumatic disorders of mitral, aortic and tricuspid valves: Secondary | ICD-10-CM | POA: Diagnosis present

## 2020-04-24 DIAGNOSIS — N1832 Chronic kidney disease, stage 3b: Secondary | ICD-10-CM | POA: Diagnosis not present

## 2020-04-24 HISTORY — DX: Unspecified atrial fibrillation: I48.91

## 2020-04-24 HISTORY — DX: Essential (primary) hypertension: I10

## 2020-04-24 HISTORY — DX: Unspecified dementia, unspecified severity, without behavioral disturbance, psychotic disturbance, mood disturbance, and anxiety: F03.90

## 2020-04-24 LAB — BASIC METABOLIC PANEL
Anion gap: 13 (ref 5–15)
BUN: 26 mg/dL — ABNORMAL HIGH (ref 8–23)
CO2: 27 mmol/L (ref 22–32)
Calcium: 9.6 mg/dL (ref 8.9–10.3)
Chloride: 102 mmol/L (ref 98–111)
Creatinine, Ser: 1.64 mg/dL — ABNORMAL HIGH (ref 0.61–1.24)
GFR calc Af Amer: 43 mL/min — ABNORMAL LOW (ref >60)
GFR calc non Af Amer: 37 mL/min — ABNORMAL LOW (ref >60)
Glucose, Bld: 160 mg/dL — ABNORMAL HIGH (ref 70–99)
Potassium: 3.8 mmol/L (ref 3.5–5.1)
Sodium: 142 mmol/L (ref 135–145)

## 2020-04-24 LAB — CBC WITH DIFFERENTIAL/PLATELET
Abs Immature Granulocytes: 0.06 10*3/uL (ref 0.00–0.07)
Basophils Absolute: 0.1 10*3/uL (ref 0.0–0.1)
Basophils Relative: 1 %
Eosinophils Absolute: 0.1 10*3/uL (ref 0.0–0.5)
Eosinophils Relative: 1 %
HCT: 36.2 % — ABNORMAL LOW (ref 39.0–52.0)
Hemoglobin: 11.5 g/dL — ABNORMAL LOW (ref 13.0–17.0)
Immature Granulocytes: 1 %
Lymphocytes Relative: 16 %
Lymphs Abs: 1.5 10*3/uL (ref 0.7–4.0)
MCH: 31.4 pg (ref 26.0–34.0)
MCHC: 31.8 g/dL (ref 30.0–36.0)
MCV: 98.9 fL (ref 80.0–100.0)
Monocytes Absolute: 1.3 10*3/uL — ABNORMAL HIGH (ref 0.1–1.0)
Monocytes Relative: 15 %
Neutro Abs: 6.2 10*3/uL (ref 1.7–7.7)
Neutrophils Relative %: 66 %
Platelets: 157 10*3/uL (ref 150–400)
RBC: 3.66 MIL/uL — ABNORMAL LOW (ref 4.22–5.81)
RDW: 14.8 % (ref 11.5–15.5)
WBC: 9.2 10*3/uL (ref 4.0–10.5)
nRBC: 0 % (ref 0.0–0.2)

## 2020-04-24 LAB — TSH: TSH: 1.18 u[IU]/mL (ref 0.350–4.500)

## 2020-04-24 LAB — PROCALCITONIN: Procalcitonin: 0.74 ng/mL

## 2020-04-24 LAB — ECHOCARDIOGRAM COMPLETE

## 2020-04-24 MED ORDER — SODIUM CHLORIDE 0.9 % IV SOLN: 250.0000 mL | INTRAVENOUS | Status: DC | PRN

## 2020-04-24 MED ORDER — SIMVASTATIN 20 MG PO TABS
40.0000 mg | ORAL_TABLET | Freq: Every day | ORAL | Status: DC
  Administered 2020-04-24 – 2020-04-25 (×2): 40 mg via ORAL
  Filled 2020-04-24 (×2): qty 2

## 2020-04-24 MED ORDER — GUAIFENESIN ER 600 MG PO TB12: 1200.0000 mg | ORAL_TABLET | Freq: Two times a day (BID) | ORAL | Status: DC

## 2020-04-24 MED ORDER — ACETAMINOPHEN 325 MG PO TABS: 650.0000 mg | ORAL_TABLET | ORAL | Status: DC | PRN

## 2020-04-24 MED ORDER — POLYETHYL GLYCOL-PROPYL GLYCOL 0.4-0.3 % OP SOLN: 1.0000 [drp] | Freq: Every day | OPHTHALMIC | Status: DC

## 2020-04-24 MED ORDER — DOCUSATE SODIUM 100 MG PO CAPS: 100.0000 mg | ORAL_CAPSULE | Freq: Two times a day (BID) | ORAL | Status: DC | PRN

## 2020-04-24 MED ORDER — VITAMIN D 25 MCG (1000 UNIT) PO TABS
1000.0000 [IU] | ORAL_TABLET | Freq: Every day | ORAL | Status: DC
  Administered 2020-04-24 – 2020-04-26 (×3): 1000 [IU] via ORAL
  Filled 2020-04-24 (×3): qty 1

## 2020-04-24 MED ORDER — OMEGA-3-ACID ETHYL ESTERS 1 G PO CAPS
1.0000 g | ORAL_CAPSULE | Freq: Every day | ORAL | Status: DC
  Administered 2020-04-24 – 2020-04-26 (×3): 1 g via ORAL
  Filled 2020-04-24 (×3): qty 1

## 2020-04-24 MED ORDER — SODIUM CHLORIDE 0.9% FLUSH: 3.0000 mL | INTRAVENOUS | Status: DC | PRN

## 2020-04-24 MED ORDER — SODIUM CHLORIDE 0.9% FLUSH
3.0000 mL | Freq: Two times a day (BID) | INTRAVENOUS | Status: DC
  Administered 2020-04-24 – 2020-04-26 (×5): 3 mL via INTRAVENOUS

## 2020-04-24 MED ORDER — POLYVINYL ALCOHOL 1.4 % OP SOLN
1.0000 [drp] | OPHTHALMIC | Status: DC | PRN
  Filled 2020-04-24: qty 15

## 2020-04-24 MED ORDER — ASPIRIN EC 81 MG PO TBEC
81.0000 mg | DELAYED_RELEASE_TABLET | Freq: Every day | ORAL | Status: DC
  Administered 2020-04-24 – 2020-04-26 (×3): 81 mg via ORAL
  Filled 2020-04-24 (×3): qty 1

## 2020-04-24 MED ORDER — ONDANSETRON HCL 4 MG/2ML IJ SOLN: 4.0000 mg | Freq: Four times a day (QID) | INTRAMUSCULAR | Status: DC | PRN

## 2020-04-24 MED ORDER — TRAZODONE HCL 50 MG PO TABS: 50.0000 mg | ORAL_TABLET | Freq: Every day | ORAL | Status: DC

## 2020-04-24 MED ORDER — FLUTICASONE PROPIONATE 50 MCG/ACT NA SUSP: 2.0000 | Freq: Every day | NASAL | Status: DC

## 2020-04-24 MED ORDER — FUROSEMIDE 10 MG/ML IJ SOLN
20.0000 mg | Freq: Two times a day (BID) | INTRAMUSCULAR | Status: AC
  Administered 2020-04-24: 20 mg via INTRAVENOUS
  Filled 2020-04-24 (×2): qty 2

## 2020-04-24 MED ORDER — HEPARIN SODIUM (PORCINE) 5000 UNIT/ML IJ SOLN
5000.0000 [IU] | Freq: Three times a day (TID) | INTRAMUSCULAR | Status: DC
  Administered 2020-04-24 – 2020-04-26 (×6): 5000 [IU] via SUBCUTANEOUS
  Filled 2020-04-24 (×6): qty 1

## 2020-04-24 MED ORDER — ORAL CARE MOUTH RINSE
15.0000 mL | Freq: Two times a day (BID) | OROMUCOSAL | Status: DC
  Administered 2020-04-24 – 2020-04-26 (×4): 15 mL via OROMUCOSAL

## 2020-04-24 MED ORDER — DRONABINOL 2.5 MG PO CAPS
2.5000 mg | ORAL_CAPSULE | Freq: Two times a day (BID) | ORAL | Status: DC
  Administered 2020-04-25 (×2): 2.5 mg via ORAL
  Filled 2020-04-24 (×2): qty 1

## 2020-04-24 MED ORDER — FUROSEMIDE 10 MG/ML IJ SOLN: 20.0000 mg | Freq: Two times a day (BID) | INTRAMUSCULAR | Status: DC

## 2020-04-24 MED ORDER — BUDESONIDE 0.5 MG/2ML IN SUSP
0.5000 mg | Freq: Two times a day (BID) | RESPIRATORY_TRACT | Status: DC
  Administered 2020-04-24 – 2020-04-26 (×4): 0.5 mg via RESPIRATORY_TRACT
  Filled 2020-04-24 (×5): qty 2

## 2020-04-24 MED ORDER — BUMETANIDE 0.25 MG/ML IJ SOLN: 0.5000 mg | Freq: Two times a day (BID) | INTRAMUSCULAR | Status: DC

## 2020-04-24 NOTE — Consult Note (Signed)
Cardiology Consultation:   Patient ID: Austin Roberts; [id number]; 1929-07-03   Admit date: 05/18/2020 Date of Consult: 05/13/2020  Primary Care Provider: Aida Puffer, MD Primary Cardiologist: [facility] Primary Electrophysiologist: None   Patient Profile:   Austin Roberts is an 86 y.o. male with a history of CAD status post CABG approximately 10 years ago, permanent atrial fibrillation, hypertension, OSA, COPD, dementia with failure to thrive, and possibly aortic stenosis who is being seen today for the evaluation of possible aortic stenosis and congestive heart failure at the request of Dr Chipper Herb.  History of Present Illness:   Austin Roberts presents to the hospitalist service in transfer from [facility] after an apparent fall at home.  Per review of limited records it is suspected based on discussion with the patient's daughter that Austin Roberts tripped while using his walker at home.  It is not entirely clear that he had frank syncope.  On my interview he is somnolent, answers questions briefly with little detail, does not recall recent events or prior cardiac history.  I am not able to locate any outside cardiac records for review other than the recent ECG done at [facility] and outlined below.  Troponin I levels are minimally increased at 0.436 and 0.485, not consistent with ACS. Patient shakes his head "no" when asked about any recent chest pain.  Telemetry shows atrial fibrillation, rate controlled with intermittent PVCs and brief NSVT.  Chest x-ray reports mixed interstitial markings and opacification with bilateral pleural effusions.  Past Medical History:  Diagnosis Date   Arthritis    Atrial fibrillation (HCC)    Coronary artery disease    Dementia (HCC)    Essential hypertension    Hyperlipidemia    Myocardial infarction [facility])    Sleep apnea     Past Surgical History:  Procedure Laterality Date   Brain bleed     BRAIN SURGERY      CORONARY ARTERY BYPASS GRAFT     CYSTOSCOPY W/ URETERAL STENT REMOVAL Right 10/03/2015   Procedure: CYSTOSCOPY WITH RIGHT STENT REMOVAL, RETROGRADE WITH RIGHT STENT PLACEMENT;  Surgeon: Ihor Gully, MD;  Location: [facility];  Service: Urology;  Laterality: Right;   KIDNEY STONE SURGERY     TONSILLECTOMY       Inpatient Medications: Scheduled Meds:  aspirin EC  81 mg Oral Daily   budesonide  0.5 mg Nebulization BID   bumetanide (BUMEX) IV  0.5 mg Intravenous Q12H   cholecalciferol  1,000 Units Oral Daily   dronabinol  2.5 mg Oral BID AC   fluticasone  2 spray Each Nare Daily   guaiFENesin  1,200 mg Oral BID   heparin  5,000 Units Subcutaneous Q12H   omega-3 acid ethyl esters  1 g Oral Daily   Polyethyl Glycol-Propyl Glycol  1 drop Both Eyes Daily   simvastatin  40 mg Oral QHS   sodium chloride flush  3 mL Intravenous Q12H   traZODone  50 mg Oral QHS   Continuous Infusions:  sodium chloride     PRN Meds: sodium chloride, acetaminophen, docusate sodium, ondansetron (ZOFRAN) IV, sodium chloride flush  Allergies:   No Known Allergies  Social History:   Social History   Tobacco Use   Smoking status: Former Smoker   Smokeless tobacco: Never Used  Substance Use Topics   Alcohol use: No    Family History:   The patient's family history includes AAA (abdominal aortic aneurysm) in his sister; CAD in his brother and mother; Lung  cancer in his father.  ROS:  Limited due to patient's current mental status.  Physical Exam/Data:   Vitals:   05/18/2020 1029  BP: (!) 125/53  Pulse: 73  Resp: (!) 34  Temp: 98 F (36.7 C)  TempSrc: Oral  SpO2: (!) 89%   No intake or output data in the 24 hours ending 05/08/2020 1357 There were no vitals filed for this visit. There is no height or weight on file to calculate BMI.   Gen: Frail and cachectic appearing elderly male, curled up in bed on his side. HEENT: Conjunctiva and lids normal. Neck: Supple, no elevated JVP  or carotid bruits, no thyromegaly. Lungs: Scattered rhonchi, decreased breath sounds at the bases. Cardiac: Irregularly irregular, no S3, 2/6 systolic murmur, no pericardial rub. Abdomen: Soft, nontender, bowel sounds present. Extremities: Diffuse muscle wasting evident. Skin: Warm and dry. Musculoskeletal: Kyphotic. Neuropsychiatric: Alert and oriented x2, somnolent, flat affect.  EKG:  An ECG dated 04/23/2020 was personally reviewed today and demonstrated:  Atrial fibrillation with left anterior fascicular block/IVCD, increased voltage.  Telemetry:  I personally reviewed telemetry which shows atrial fibrillation with PVCs and brief NSVT noted  Relevant CV Studies:  No prior cardiac studies available for review.  Laboratory Data:  [facility] ER 04/23/2020: SARS coronavirus 2 negative, pro-BNP greater than 35,000, troponin I 0.436 and 0.485, potassium 3.8, BUN 30, creatinine 1.5, AST 19, ALT 11, TSH 1.95, INR 1.2  Radiology/Studies:  DG Chest 2 View  Result Date: 05/08/2020 CLINICAL DATA:  86 year old male with a history of CHF EXAM: CHEST - 2 VIEW COMPARISON:  04/23/2020 chest CT 02/02/2017, plain from 02/02/2017 FINDINGS: Cardiomediastinal silhouette unchanged in size and contour with cardiomegaly. Heart borders partially obscured by overlying lung/pleural disease. Mixed interstitial and airspace opacities of the bilateral lungs, unchanged from the comparison. Meniscus at the right lung base. Blunting of the left costophrenic angle. No evidence of pneumothorax. Surgical changes of median sternotomy and CABG. IMPRESSION: Similar appearance of the chest x-ray to the prior, with bilateral mixed interstitial and airspace disease and right greater than left pleural effusions. No evidence of pneumothorax on the current plain film. Surgical changes of median sternotomy and CABG, with cardiomegaly. Electronically Signed   By: Gilmer Mor D.O.   On: 05/05/2020 13:50    Assessment and Plan:    1.  Possible aortic stenosis by history, systolic murmur in aortic position.  At this point no objective cardiac testing available in terms of cardiac structure and function, no outside records to review.  Echocardiogram has been ordered and pending at this time.  2.  Chest x-ray showing mixed interstitial and airspace opacities with bilateral pleural effusions.  Congestive heart failure being considered, systolic versus diastolic not yet determined and echocardiogram pending.   3.  Reported history of CAD status post CABG approximately 10 years ago.  Details are not clear.  He has had some follow-up with the [facility] system.  Troponin I levels from [facility] are not suggestive of ACS.  Prior LVEF unknown.  4.  Apparent permanent atrial fibrillation based on records.  He is not anticoagulated, I do see prior history of "brain bleed" and surgery listed in the chart, details are unclear.  5.  Possible dementia and failure to thrive.  Patient appears cachectic and with severe protein calorie malnutrition.  6.  History of COPD and OSA.  7.  Fall at home.  Patient is currently on aspirin and Zocor.  Agree with discontinuation of heparin as ACS  is not suspected, would use basic DVT prophylaxis.  Follow-up echocardiogram to assess cardiac structure and function.  Can make further recommendations once more objective information is available, however an aggressive, invasive cardiac work-up would not be considered in this case given this patient's current condition and apparent poor functional capacity.  I would suggest getting the case manager to assess home situation and talk with daughter as well.  May also need further directed goals of care discussions.  Signed, Nona Dell, MD  05/16/2020 1:57 PM

## 2020-04-24 NOTE — Progress Notes (Signed)
°  Echocardiogram 2D Echocardiogram has been performed.  Gerda Diss 2020/05/05, 3:58 PM

## 2020-04-24 NOTE — H&P (Signed)
History and Physical    Austin Roberts NTI:[id number] DOB: February 08, 1929 DOA: 05-19-20  PCP: Aida Puffer, MD (Confirm with patient/family/NH records and if not entered, this has to be entered at [facility] point of entry) Patient coming from: Home  I have personally briefly reviewed patient's old medical records in [facility]  Chief Complaint: Fall  HPI: Austin Roberts is a 86 y.o. male with medical history significant of CAD status post CABG 10 years ago, chronic A. fib aortic stenosis, hypertension, sleep apnea, question of COPD, presented with fall.  Patient very sleepy and confused, most history provided by his daughter over the phone.  According to daughter, patient has occasional exertional dyspnea, was evaluated by [facility] and local nurse practitioner and was diagnosed with COPD last year.  Patient has been on as needed albuterol pump once a day for shortness breath.  And patient never complained about lightheadedness blurry vision chest pain.  Last night, patient fell down for unknown reasons.  Daughter suspect patient tripped.  Patient was taken to [facility] ED, work-up showed patient had pulmonary congestion and bilateral pleural effusion.  Troponin elevated 0.436> 0.485.  CT head negative.  Patient shifted to [facility] for cardiology evaluation.  Daughter also reported the patient has had poor appetite for more than a year and had significant weight loss.  And patient has had worsening of mentation with periodic confusion this year and daughter took him to PCP who suspected patient had early dementia.   Review of Systems: Unable to obtain patient lethargic and confused  Past Medical History:  Diagnosis Date  . Arthritis    spine and hands   . Asthma   . Coronary artery disease   . Hyperlipidemia   . Hypertension   . Myocardial infarction (HCC)   . Sleep apnea    not using CPAP- for 4 years     Past Surgical History:  Procedure Laterality Date  . Brain bleed      . BRAIN SURGERY    . CARDIAC SURGERY    . CORONARY ARTERY BYPASS GRAFT    . CYSTOSCOPY W/ URETERAL STENT REMOVAL Right 10/03/2015   Procedure: CYSTOSCOPY WITH RIGHT STENT REMOVAL, RETROGRADE WITH RIGHT STENT PLACEMENT;  Surgeon: Ihor Gully, MD;  Location: [facility];  Service: Urology;  Laterality: Right;  . KIDNEY STONE SURGERY    . TONSILLECTOMY       reports that he has quit smoking. He has never used smokeless tobacco. He reports that he does not drink alcohol and does not use drugs.  No Known Allergies  Family History  Problem Relation Age of Onset  . CAD Mother   . Lung cancer Father   . AAA (abdominal aortic aneurysm) Sister   . CAD Brother      Prior to Admission medications   Medication Sig Start Date End Date Taking? Authorizing Provider  amLODipine (NORVASC) 10 MG tablet Take 10 mg by mouth daily.     [provider]  aspirin EC 81 MG tablet Take 81 mg by mouth daily.    [provider]  cholecalciferol (VITAMIN D) 1000 units tablet Take 1,000 Units by mouth daily.    [provider]  docusate sodium (COLACE) 100 MG capsule Take 100 mg by mouth 2 (two) times daily as needed for mild constipation.    [provider]  fluticasone (FLONASE) 50 MCG/ACT nasal spray Place 2 sprays into both nostrils daily.    [provider]  furosemide (LASIX) 40  MG tablet Take 40 mg by mouth daily.    [provider]  guaiFENesin (MUCINEX) 600 MG 12 hr tablet Take 2 tablets (1,200 mg total) by mouth 2 (two) times daily. 02/02/17   Elgergawy, Leana Roe, MD  HYDROcodone-acetaminophen (NORCO) 7.5-325 MG tablet Take 1-2 tablets by mouth every 4 (four) hours as needed for moderate pain. Maximum dose per 24 hours - 8 pills 10/03/15   Ihor Gully, MD  mometasone [facility]) 220 MCG/INH inhaler Inhale 2 puffs into the lungs daily.    [provider]  omega-3 acid ethyl esters (LOVAZA) 1 g capsule Take 1 g by mouth daily.    [provider]  Polyethyl Glycol-Propyl Glycol (SYSTANE) 0.4-0.3 % SOLN Place 1 drop into both eyes daily.    [provider]  potassium chloride (K-DUR) 10 MEQ tablet Please take 40 meq you oral daily for 3 days, then 20 ameq oral daily for 4 days then stop. 02/02/17   Elgergawy, Leana Roe, MD  simvastatin (ZOCOR) 40 MG tablet Take 40 mg by mouth at bedtime.     [provider]  traZODone (DESYREL) 50 MG tablet Take 50 mg by mouth at bedtime.    [provider]    Physical Exam: Vitals:   04/21/2020 1029  BP: (!) 125/53  Pulse: 73  Resp: (!) 34  Temp: 98 F (36.7 C)  TempSrc: Oral  SpO2: (!) 89%    Constitutional: NAD, calm, comfortable Vitals:   04/22/2020 1029  BP: (!) 125/53  Pulse: 73  Resp: (!) 34  Temp: 98 F (36.7 C)  TempSrc: Oral  SpO2: (!) 89%   Eyes: PERRL, lids and conjunctivae normal ENMT: Mucous membranes are moist. Posterior pharynx clear of any exudate or lesions.Normal dentition.  Neck: normal, supple, no masses, no thyromegaly Respiratory: clear to auscultation bilaterally, fine crackles on bilateral lower bases. Normal respiratory effort. No accessory muscle use.  Cardiovascular: Irregular heart rate, diminished S2, loud systolic murmur on heart base. No extremity edema. 2+ pedal pulses. No carotid bruits.  Abdomen: no tenderness, no masses palpated. No hepatosplenomegaly. Bowel sounds positive.  Musculoskeletal: no clubbing / cyanosis. No joint deformity upper and lower extremities. Good ROM, no contractures. Normal muscle tone.  Skin: no rashes, lesions, ulcers. No induration Neurologic: Opens eyes, moving all limbs. Psychiatric: Opens eyes, lethargic.   Labs on Admission: I have personally reviewed following labs and imaging studies  CBC: No results for input(s): WBC, NEUTROABS, HGB, HCT, MCV, PLT in the last 168 hours. Basic Metabolic Panel: No results for input(s): NA, K, CL, CO2, GLUCOSE, BUN, CREATININE, CALCIUM, MG, PHOS  in the last 168 hours. GFR: CrCl cannot be calculated (Patient's most recent lab result is older than the maximum 21 days allowed.). Liver Function Tests: No results for input(s): AST, ALT, ALKPHOS, BILITOT, PROT, ALBUMIN in the last 168 hours. No results for input(s): LIPASE, AMYLASE in the last 168 hours. No results for input(s): AMMONIA in the last 168 hours. Coagulation Profile: No results for input(s): INR, PROTIME in the last 168 hours. Cardiac Enzymes: No results for input(s): CKTOTAL, CKMB, CKMBINDEX, TROPONINI in the last 168 hours. BNP (last 3 results) No results for input(s): PROBNP in the last 8760 hours. HbA1C: No results for input(s): HGBA1C in the last 72 hours. CBG: No results for input(s): GLUCAP in the last 168 hours. Lipid Profile: No results for input(s): CHOL, HDL, LDLCALC, TRIG, CHOLHDL, LDLDIRECT in the last 72 hours. Thyroid Function Tests: No results for input(s): TSH,  T4TOTAL, FREET4, T3FREE, THYROIDAB in the last 72 hours. Anemia Panel: No results for input(s): VITAMINB12, FOLATE, FERRITIN, TIBC, IRON, RETICCTPCT in the last 72 hours. Urine analysis:    Component Value Date/Time   COLORURINE YELLOW 05/26/2015 1448   APPEARANCEUR CLOUDY (A) 05/26/2015 1448   LABSPEC 1.011 05/26/2015 1448   PHURINE 5.5 05/26/2015 1448   GLUCOSEU NEGATIVE 05/26/2015 1448   HGBUR LARGE (A) 05/26/2015 1448   BILIRUBINUR NEGATIVE 05/26/2015 1448   KETONESUR NEGATIVE 05/26/2015 1448   PROTEINUR 30 (A) 05/26/2015 1448   UROBILINOGEN 0.2 05/26/2015 1448   NITRITE NEGATIVE 05/26/2015 1448   LEUKOCYTESUR MODERATE (A) 05/26/2015 1448    Radiological Exams on Admission: No results found.  EKG: Independently reviewed.  A. fib  Assessment/Plan Active Problems:   Acute CHF (congestive heart failure) (HCC)   CHF (congestive heart failure) (HCC)  (please populate well all problems here in Problem List. (For example, if patient is on BP meds at home and you resume or decide to  hold them, it is a problem that needs to be her. Same for CAD, COPD, HLD and so on)  Acute on chronic CHF decompensation plus aortic stenosis decompensation -Via auscultation, suspect patient has severe aortic stenosis, ordered echocardiogram.  Discussed with cardiologist, who will see the patient.  Discussed with patient daughter, from the patient CODE STATUS is DNR.  Family prefers conservative management, will discuss with cardiologist regarding the findings and the plan. -Clinically patient still has signs of overload, given the chest x-ray showing this morning, will switch to Bumex twice daily.  Positive troponins -Pattern is flat, likely from decompensated CHF/aortic stenosis, discontinue heparin drip  HTN - Hold amlodipine to allow more diuresis  COPD -Is not wheezing, probably at his baseline  AKI vs CKD -Daughter reported patient had obstructed kidney stone in the past, will order renal ultrasound -Now his mild fluid overload, will continue diuresis for today  Severe protein calorie malnutrition -May related to advanced CHF/aortic stenosis -Consult transition care, dietitian -PT evaluation -Start Marinol  Chronic A. Fib -Rate controlled.  Not on anticoagulation due to severe bleeding in the past  DVT prophylaxis: Heparin subcu Code Status: DNR Family Communication: Daughter over the phone Disposition Plan: Patient condition severe, expect more than 2 midnight hospital stay for cardiac work-up Consults called: Cardiology Admission status: Tele admit   Emeline General MD [facility] Pager [phone]    05/12/2020, 1:40 PM

## 2020-04-24 NOTE — Progress Notes (Signed)
Patient refused CPAP for the night  

## 2020-04-25 ENCOUNTER — Inpatient Hospital Stay (HOSPITAL_COMMUNITY): Payer: No Typology Code available for payment source

## 2020-04-25 DIAGNOSIS — I350 Nonrheumatic aortic (valve) stenosis: Secondary | ICD-10-CM

## 2020-04-25 DIAGNOSIS — N179 Acute kidney failure, unspecified: Secondary | ICD-10-CM

## 2020-04-25 DIAGNOSIS — Z7189 Other specified counseling: Secondary | ICD-10-CM

## 2020-04-25 DIAGNOSIS — I5023 Acute on chronic systolic (congestive) heart failure: Secondary | ICD-10-CM

## 2020-04-25 DIAGNOSIS — I10 Essential (primary) hypertension: Secondary | ICD-10-CM

## 2020-04-25 DIAGNOSIS — R000 Tachycardia, unspecified: Secondary | ICD-10-CM

## 2020-04-25 DIAGNOSIS — E43 Unspecified severe protein-calorie malnutrition: Secondary | ICD-10-CM

## 2020-04-25 DIAGNOSIS — R079 Chest pain, unspecified: Secondary | ICD-10-CM

## 2020-04-25 DIAGNOSIS — N1831 Chronic kidney disease, stage 3a: Secondary | ICD-10-CM

## 2020-04-25 DIAGNOSIS — Z515 Encounter for palliative care: Secondary | ICD-10-CM

## 2020-04-25 DIAGNOSIS — Z66 Do not resuscitate: Secondary | ICD-10-CM

## 2020-04-25 LAB — BASIC METABOLIC PANEL
Anion gap: 9 (ref 5–15)
BUN: 25 mg/dL — ABNORMAL HIGH (ref 8–23)
CO2: 32 mmol/L (ref 22–32)
Calcium: 9.3 mg/dL (ref 8.9–10.3)
Chloride: 101 mmol/L (ref 98–111)
Creatinine, Ser: 1.62 mg/dL — ABNORMAL HIGH (ref 0.61–1.24)
GFR calc Af Amer: 43 mL/min — ABNORMAL LOW (ref >60)
GFR calc non Af Amer: 37 mL/min — ABNORMAL LOW (ref >60)
Glucose, Bld: 123 mg/dL — ABNORMAL HIGH (ref 70–99)
Potassium: 3.6 mmol/L (ref 3.5–5.1)
Sodium: 142 mmol/L (ref 135–145)

## 2020-04-25 MED ORDER — MORPHINE SULFATE (CONCENTRATE) 10 MG/0.5ML PO SOLN
5.0000 mg | Freq: Three times a day (TID) | ORAL | Status: DC
  Administered 2020-04-25 – 2020-04-26 (×2): 5 mg via SUBLINGUAL
  Filled 2020-04-25 (×2): qty 0.5

## 2020-04-25 MED ORDER — ENSURE ENLIVE PO LIQD
237.0000 mL | Freq: Three times a day (TID) | ORAL | Status: DC
  Administered 2020-04-25 – 2020-04-26 (×4): 237 mL via ORAL

## 2020-04-25 NOTE — Progress Notes (Signed)
Patient ID: Austin Roberts, male   DOB: 09-01-1929, 86 y.o.   MRN: [id number]  PROGRESS NOTE    Gustaf Mccarter  IWL:[id number] DOB: [birth year] DOA: 05-07-2020 PCP: Aida Puffer, MD   Brief Narrative:  86 year old male with history of CAD status post CABG 10 years ago, chronic A. fib, aortic stenosis, hypertension, sleep apnea, question of COPD, dementia presented with fall and worsening confusion.  He was taken to [facility] ED where work-up showed pulmonary congestion and bilateral pleural effusions with troponin elevation up to 0.485.  CT head was negative.  He was transferred to [facility] for further cardiology evaluation.  Assessment & Plan:   Acute on chronic systolic CHF Mildly positive troponin -Cardiology following.  Echo shows EF of 40 to 45% with severe AS -Troponin only mildly positive and did not trend up: Probably from CHF exacerbation -Diuretic dosing as per cardiology.  Strict input output.  Daily weights.  Fluid restriction.  Severe aortic stenosis -Follow cardiology recommendations.  Doubt that patient would be a candidate for any surgical intervention  Chronic A. Fib -Rate controlled.  Continue aspirin.  Not on chronic anticoagulant as an outpatient given history of falls.  Hypertension -Monitor.  Blood pressure on the lower side.  Diuretic plan as per cardiology.  COPD -Stable.  Use nebs as needed  AKI on chronic kidney disease stage IIIa -Patient probably has chronic kidney disease stage IIIa.  Unknown recent baseline.  Presented with creatinine of 1.5 at [facility] ED.  1.64 yesterday on presentation to our hospital.  Creatinine 1.62 today.  Monitor.  Anemia of chronic disease -probably from chronic kidney disease.  Hemoglobin stable.  Monitor  Severe protein calorie malnutrition/failure to thrive Generalized deconditioning -Overall prognosis is guarded to poor.  Palliative care consultation.  Patient probably is a candidate for  hospice. -PT eval. -Marinol has been started on admission   DVT prophylaxis: Heparin subcutaneous  code Status: DNR Family Communication: Called daughter/[place] on phone on 04/25/2020 but she did not pick up Disposition Plan: Status is: Inpatient  Remains inpatient appropriate because:Inpatient level of care appropriate due to severity of illness   Dispo: The patient is from: Home              Anticipated d/c is to: SNF              Anticipated d/c date is: 2 days              Patient currently is not medically stable to d/c.   Consultants: Cardiology.  Will consult palliative care  Procedures: Echo 1. Left ventricular ejection fraction, by estimation, is 40 to 45%. The  left ventricle has mildly decreased function. The left ventricle  demonstrates global hypokinesis. There is moderate left ventricular  hypertrophy. Left ventricular diastolic  parameters are indeterminate.  2. Right ventricular systolic function is mildly reduced. The right  ventricular size is mildly enlarged. There is moderately elevated  pulmonary artery systolic pressure. The estimated right ventricular  systolic pressure is 45.8 mmHg.  3. Left atrial size was moderately dilated.  4. Right atrial size was mildly dilated.  5. The mitral valve is degenerative. Mild to moderate mitral valve  regurgitation.  6. Tricuspid valve regurgitation is mild to moderate.  7. The aortic valve is tricuspid, severely calcified. Aortic valve  regurgitation is mild. Severe, low gradient aortic valve stenosis. Aortic  valve mean gradient measures 25.0 mmHg. Aortic valve Vmax measures 3.45  m/s. Dimentionless index 0.18.  8. The  inferior vena cava is normal in size with greater than 50%  respiratory variability, suggesting right atrial pressure of 3 mmHg.   Antimicrobials:  None  Subjective: Patient seen and examined at present.  He is sleepy, wakes up lightly, very poor historian, hardly answers any questions.   No overnight fever or vomiting reported.  Objective: Vitals:   25-Apr-2020 2353 04/25/20 0456 04/25/20 0740 04/25/20 0743  BP: (!) 103/43   (!) 115/54  Pulse:    65  Resp: 20   20  Temp: 97.7 F (36.5 C) (!) 97.4 F (36.3 C)  97.8 F (36.6 C)  TempSrc: Oral Oral  Oral  SpO2:   96% 97%  Weight:  61.7 kg      Intake/Output Summary (Last 24 hours) at 04/25/2020 1045 Last data filed at 04/25/2020 8413 Gross per 24 hour  Intake 243 ml  Output 500 ml  Net -257 ml   Filed Weights   04/25/20 0456  Weight: 61.7 kg    Examination:  General exam: Extremely ill looking elderly male.  Looks cachectic.  No distress Respiratory system: Bilateral decreased breath sounds at bases with scattered crackles Cardiovascular system: S1 & S2 heard, Rate controlled Gastrointestinal system: Abdomen is nondistended, soft and nontender. Normal bowel sounds heard. Extremities: No cyanosis, clubbing; bilateral lower extremity edema present Central nervous system: Sleepy, wakes up slightly, very poor historian, confused no focal neurological deficits. Moving extremities Skin: No rashes, lesions or ulcers Psychiatry: Could not be assessed because of mental status    Data Reviewed: I have personally reviewed following labs and imaging studies  CBC: Recent Labs  Lab Apr 25, 2020 1355  WBC 9.2  NEUTROABS 6.2  HGB 11.5*  HCT 36.2*  MCV 98.9  PLT 157   Basic Metabolic Panel: Recent Labs  Lab 2020-04-25 1355 04/25/20 0410  NA 142 142  K 3.8 3.6  CL 102 101  CO2 27 32  GLUCOSE 160* 123*  BUN 26* 25*  CREATININE 1.64* 1.62*  CALCIUM 9.6 9.3   GFR: CrCl cannot be calculated (Unknown ideal weight.). Liver Function Tests: No results for input(s): AST, ALT, ALKPHOS, BILITOT, PROT, ALBUMIN in the last 168 hours. No results for input(s): LIPASE, AMYLASE in the last 168 hours. No results for input(s): AMMONIA in the last 168 hours. Coagulation Profile: No results for input(s): INR, PROTIME in the  last 168 hours. Cardiac Enzymes: No results for input(s): CKTOTAL, CKMB, CKMBINDEX, TROPONINI in the last 168 hours. BNP (last 3 results) No results for input(s): PROBNP in the last 8760 hours. HbA1C: No results for input(s): HGBA1C in the last 72 hours. CBG: No results for input(s): GLUCAP in the last 168 hours. Lipid Profile: No results for input(s): CHOL, HDL, LDLCALC, TRIG, CHOLHDL, LDLDIRECT in the last 72 hours. Thyroid Function Tests: Recent Labs    Apr 25, 2020 1635  TSH 1.180   Anemia Panel: No results for input(s): VITAMINB12, FOLATE, FERRITIN, TIBC, IRON, RETICCTPCT in the last 72 hours. Sepsis Labs: Recent Labs  Lab 04/25/20 1355  PROCALCITON 0.74    No results found for this or any previous visit (from the past 240 hour(s)).       Radiology Studies: DG Chest 2 View  Result Date: 04/25/2020 CLINICAL DATA:  86 year old male with a history of CHF EXAM: CHEST - 2 VIEW COMPARISON:  04/23/2020 chest CT 02/02/2017, plain from 02/02/2017 FINDINGS: Cardiomediastinal silhouette unchanged in size and contour with cardiomegaly. Heart borders partially obscured by overlying lung/pleural disease. Mixed interstitial and airspace opacities of the  bilateral lungs, unchanged from the comparison. Meniscus at the right lung base. Blunting of the left costophrenic angle. No evidence of pneumothorax. Surgical changes of median sternotomy and CABG. IMPRESSION: Similar appearance of the chest x-ray to the prior, with bilateral mixed interstitial and airspace disease and right greater than left pleural effusions. No evidence of pneumothorax on the current plain film. Surgical changes of median sternotomy and CABG, with cardiomegaly. Electronically Signed   By: Gilmer Mor D.O.   On: 05/06/2020 13:50   US RENAL  Result Date: 04/28/2020 CLINICAL DATA:  Acute kidney injury EXAM: RENAL / URINARY TRACT ULTRASOUND COMPLETE COMPARISON:  Same day chest radiograph. Renal ultrasound dated 12/12/2015.  FINDINGS: Right Kidney: Renal measurements: 9.4 x 4.4 x 5.0 cm = volume: 103 mL . Echogenicity is increased. A 6 mm nonobstructive calculus is seen. No mass or hydronephrosis visualized. Left Kidney: Renal measurements: 10.8 x 5.3 x 6.6 cm = volume: 200 mL. Echogenicity is increased. There are multiple ureteral calculi seen in the mid ureter measuring up to 2 cm with resulting severe hydronephrosis. Multiple renal calculi are seen, measuring up to 1.1 cm. No mass is visualized. Bladder: Appears normal for degree of bladder distention. Other: A right pleural effusion is seen. IMPRESSION: Multiple obstructing left ureteral calculi resulting in severe hydronephrosis. Electronically Signed   By: Romona Curls M.D.   On: 05/14/2020 21:09   ECHOCARDIOGRAM COMPLETE  Result Date: 05/11/2020    ECHOCARDIOGRAM REPORT   Patient Name:   KAHIAU SCHEWE Date of Exam: 05/14/2020 Medical Rec #:  [id number]       Height:       70.0 in Accession #:    [id number]      Weight:       180.1 lb Date of Birth:  09-Apr-1929      BSA:          1.996 m Patient Age:    89 years        BP:           125/53 mmHg Patient Gender: M               HR:           70 bpm. Exam Location:  [place] Procedure: 2D Echo, Cardiac Doppler and Color Doppler Indications:    CHF-Acute Systolic  History:        Patient has no prior history of Echocardiogram examinations. CAD                 and Previous Myocardial Infarction, Prior CABG, COPD,                 Arrythmias:Atrial Fibrillation; Risk Factors:Dyslipidemia, Sleep                 Apnea and Former Smoker.  Sonographer:    Ross Ludwig RDCS (AE) Referring Phys: [id number] [place]eline General IMPRESSIONS  1. Left ventricular ejection fraction, by estimation, is 40 to 45%. The left ventricle has mildly decreased function. The left ventricle demonstrates global hypokinesis. There is moderate left ventricular hypertrophy. Left ventricular diastolic parameters are indeterminate.  2. Right ventricular systolic function  is mildly reduced. The right ventricular size is mildly enlarged. There is moderately elevated pulmonary artery systolic pressure. The estimated right ventricular systolic pressure is 45.8 mmHg.  3. Left atrial size was moderately dilated.  4. Right atrial size was mildly dilated.  5. The mitral valve is degenerative. Mild to moderate mitral valve regurgitation.  6.  Tricuspid valve regurgitation is mild to moderate.  7. The aortic valve is tricuspid, severely calcified. Aortic valve regurgitation is mild. Severe, low gradient aortic valve stenosis. Aortic valve mean gradient measures 25.0 mmHg. Aortic valve Vmax measures 3.45 m/s. Dimentionless index 0.18.  8. The inferior vena cava is normal in size with greater than 50% respiratory variability, suggesting right atrial pressure of 3 mmHg. FINDINGS  Left Ventricle: Left ventricular ejection fraction, by estimation, is 40 to 45%. The left ventricle has mildly decreased function. The left ventricle demonstrates global hypokinesis. The left ventricular internal cavity size was normal in size. There is  moderate left ventricular hypertrophy. Left ventricular diastolic parameters are indeterminate. Right Ventricle: The right ventricular size is mildly enlarged. No increase in right ventricular wall thickness. Right ventricular systolic function is mildly reduced. There is moderately elevated pulmonary artery systolic pressure. The tricuspid regurgitant velocity is 3.27 m/s, and with an assumed right atrial pressure of 3 mmHg, the estimated right ventricular systolic pressure is 45.8 mmHg. Left Atrium: Left atrial size was moderately dilated. Right Atrium: Right atrial size was mildly dilated. Pericardium: There is no evidence of pericardial effusion. Mitral Valve: The mitral valve is degenerative in appearance. Moderate mitral annular calcification. Mild to moderate mitral valve regurgitation. Tricuspid Valve: The tricuspid valve is grossly normal. Tricuspid valve  regurgitation is mild to moderate. Aortic Valve: The aortic valve is tricuspid. Aortic valve regurgitation is mild. Aortic regurgitation PHT measures 362 msec. Severe aortic stenosis is present. Mild to moderate aortic valve annular calcification. There is severe calcifcation of the aortic valve. Aortic valve mean gradient measures 25.0 mmHg. Aortic valve peak gradient measures 47.7 mmHg. Aortic valve area, by VTI measures 0.52 cm. Pulmonic Valve: The pulmonic valve was grossly normal. Pulmonic valve regurgitation is trivial. Aorta: The aortic root is normal in size and structure. Venous: The inferior vena cava is normal in size with greater than 50% respiratory variability, suggesting right atrial pressure of 3 mmHg. IAS/Shunts: No atrial level shunt detected by color flow Doppler. Additional Comments: There is pleural effusion in the left lateral region.  LEFT VENTRICLE PLAX 2D LVIDd:         4.20 cm LVIDs:         4.10 cm LV PW:         1.60 cm LV IVS:        1.60 cm LVOT diam:     1.90 cm LV SV:         39 LV SV Index:   20 LVOT Area:     2.84 cm  LV Volumes (MOD) LV vol d, MOD A2C: 107.0 ml LV vol d, MOD A4C: 72.4 ml LV vol s, MOD A2C: 56.4 ml LV vol s, MOD A4C: 62.9 ml LV SV MOD A2C:     50.6 ml LV SV MOD A4C:     72.4 ml LV SV MOD BP:      30.2 ml RIGHT VENTRICLE            IVC RV Basal diam:  4.00 cm    IVC diam: 1.70 cm RV Mid diam:    3.50 cm RV S prime:     5.43 cm/s TAPSE (M-mode): 1.1 cm LEFT ATRIUM             Index       RIGHT ATRIUM           Index LA diam:        4.80 cm 2.40 cm/m  RA Area:     21.90 cm LA Vol (A2C):   85.2 ml 42.68 ml/m RA Volume:   66.60 ml  33.36 ml/m LA Vol (A4C):   88.2 ml 44.18 ml/m LA Biplane Vol: 87.0 ml 43.58 ml/m  AORTIC VALVE AV Area (Vmax):    0.44 cm AV Area (Vmean):   0.47 cm AV Area (VTI):     0.52 cm AV Vmax:           345.40 cm/s AV Vmean:          225.200 cm/s AV VTI:            0.758 m AV Peak Grad:      47.7 mmHg AV Mean Grad:      25.0 mmHg LVOT Vmax:          53.16 cm/s LVOT Vmean:        37.320 cm/s LVOT VTI:          0.139 m LVOT/AV VTI ratio: 0.18 AI PHT:            362 msec  AORTA Ao Root diam: 3.60 cm Ao Asc diam:  3.40 cm TRICUSPID VALVE TR Peak grad:   42.8 mmHg TR Vmax:        327.00 cm/s  SHUNTS Systemic VTI:  0.14 m Systemic Diam: 1.90 cm Nona DellSamuel Mcdowell MD Electronically signed by Nona DellSamuel Mcdowell MD Signature Date/Time: 05/09/2020/4:59:47 PM    Final         Scheduled Meds: . aspirin EC  81 mg Oral Daily  . budesonide  0.5 mg Nebulization BID  . cholecalciferol  1,000 Units Oral Daily  . dronabinol  2.5 mg Oral BID AC  . furosemide  20 mg Intravenous BID  . heparin  5,000 Units Subcutaneous Q8H  . mouth rinse  15 mL Mouth Rinse BID  . omega-3 acid ethyl esters  1 g Oral Daily  . simvastatin  40 mg Oral QHS  . sodium chloride flush  3 mL Intravenous Q12H   Continuous Infusions: . sodium chloride            Glade LloydKshitiz Aikam Vinje, MD [facility] 04/25/2020, 10:45 AM

## 2020-04-25 NOTE — Progress Notes (Signed)
Civil engineer, contracting Documentation  Liaison received referral for pt to dc to [facility] for EOL care.   Upon review of the chart, it is unclear that pt has 2 weeks or less prognosis so [facility] MD will review chart this evening to determine eligibility.   Liaison will engage with family tomorrow once determination is made.   Please do not hesitate to outreach with any questions and thank you for the referral.   Trena Platt, RN  [facility] Liaison [phone]

## 2020-04-25 NOTE — Consult Note (Addendum)
Consultation Note Date: 04/25/2020   Patient Name: Austin Roberts  DOB: [birth year]  MRN: [id number]  Age / Sex: 86 y.o., male  PCP: Tamsen Roers, MD Referring Physician: Aline August, MD  Reason for Consultation: Establishing goals of care  HPI/Patient Profile: 86 y.o. male  with past medical history of coronary artery disease status past CABG 10 years ago, chronic atrial fibrillation, aortic stenosis, hypertension, sleep apnea, COPD admitted on 05/05/2020 with acute on chronic CHF decompensation, aortic stenosis decompensation, AKI versus CKD, and severe protein calorie malnutrition.  Patient initially presented to [facility] ED after a fall, work-up revealed pulmonary congestion, bilateral pleural effusion, and elevated troponin. CT head was negative. Patient transferred to [facility] for cardiology evaluation. Creatinine 7/5 is 1.62  Clinical Assessment and Goals of Care: I have reviewed medical records including EPIC notes, labs and imaging, examined the patient and met at bedside with daughter Austin Roberts  to discuss diagnosis, prognosis, GOC, EOL wishes, disposition, and options.  I introduced Palliative Medicine as specialized medical care for people living with serious illness. It focuses on providing relief from the symptoms and stress of a serious illness.   We discussed a brief life review of the patient. He lived most of his life in [place]. He served in the [place] during the [place]. 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 He was married for many years, they had one daughter Austin Roberts. His wife passed away in 2011/01/15. He moved to [place] about 6 years ago to live with his daughter.   As far as functional and nutritional status, his daughter reports a decline over the last year particularly the last 5 months. This includes decreased po intake, weight loss, and  decreased mobility. She also reports several falls at home. He has been receiving most of his medical care from [facility] in [place].   We discussed his current illness and what it means in the larger context of his ongoing co-morbidities. Discussion is focused on the multiple cardiac diagnoses, including severe aortic stenosis for which he is not a candidate for surgical intervention. Natural trajectory of heart failure was also discussed.    The difference between aggressive medical intervention and comfort care was considered in light of the patient's goals of care. Daughter agrees that focusing on comfort is the most appropriate decision at this point. Hospice Care services were explained and offered; daughter is open to pursuing placement at a residential hospice facility.  Questions and concerns were addressed.  The family was encouraged to call with questions or concerns.   Primary decision maker: Daughter Austin Roberts    SUMMARY OF RECOMMENDATIONS   - DNR/DNI (already in place) - TOC order placed for residential hospice referral - start morphine concentrate solution 5 mg three times daily for pain - continue current treatment for now, will re-address tomorrow with recommendation to transition to comfort care  Code Status/Advance Care Planning:  DNR/DNI  Palliative Prophylaxis:   Aspiration, Frequent Pain Assessment, Oral  Care and Turn Reposition  Prognosis:   weeks   Discharge Planning: Hospice facility      Primary Diagnoses: Present on Admission: **None**   I have reviewed the medical record, interviewed the patient and family, and examined the patient. The following aspects are pertinent.  Past Medical History:  Diagnosis Date  . Arthritis   . Atrial fibrillation (HCC)   . Coronary artery disease   . Dementia (HCC)   . Essential hypertension   . Hyperlipidemia   . Myocardial infarction (HCC)   . Sleep apnea    Social History    Socioeconomic History  . Marital status: Widowed    Spouse name: Not on file  . Number of children: Not on file  . Years of education: Not on file  . Highest education level: Not on file  Occupational History  . Not on file  Tobacco Use  . Smoking status: Former Games developer  . Smokeless tobacco: Never Used  Substance and Sexual Activity  . Alcohol use: No  . Drug use: No  . Sexual activity: Not on file  Other Topics Concern  . Not on file  Social History Narrative  . Not on file   Scheduled Meds: . aspirin EC  81 mg Oral Daily  . budesonide  0.5 mg Nebulization BID  . cholecalciferol  1,000 Units Oral Daily  . dronabinol  2.5 mg Oral BID AC  . furosemide  20 mg Intravenous BID  . heparin  5,000 Units Subcutaneous Q8H  . mouth rinse  15 mL Mouth Rinse BID  . omega-3 acid ethyl esters  1 g Oral Daily  . simvastatin  40 mg Oral QHS  . sodium chloride flush  3 mL Intravenous Q12H   Continuous Infusions: . sodium chloride     PRN Meds:.sodium chloride, acetaminophen, docusate sodium, ondansetron (ZOFRAN) IV, polyvinyl alcohol, sodium chloride flush  Medications Prior to Admission:  Prior to Admission medications   Medication Sig Start Date End Date Taking? Authorizing Provider  albuterol (VENTOLIN HFA) 108 (90 Base) MCG/ACT inhaler Inhale 1 puff into the lungs every 6 (six) hours as needed for wheezing or shortness of breath.   Yes [provider]  amLODipine (NORVASC) 5 MG tablet Take 5 mg by mouth daily.   Yes [provider]  furosemide (LASIX) 40 MG tablet Take 40 mg by mouth daily.   Yes [provider]  HYDROcodone-acetaminophen (NORCO/VICODIN) 5-325 MG tablet Take 1 tablet by mouth in the morning and at bedtime.   Yes [provider]  memantine (NAMENDA) 10 MG tablet Take 10 mg by mouth daily.   Yes [provider]  omega-3 acid ethyl esters (LOVAZA) 1 g capsule Take 1 g by mouth daily.   Yes [provider]   potassium chloride (K-DUR) 10 MEQ tablet Please take 40 meq you oral daily for 3 days, then 20 ameq oral daily for 4 days then stop. Patient taking differently: Take 10 mEq by mouth 3 (three) times daily.  02/02/17  Yes Elgergawy, Leana Roe, MD  rOPINIRole (REQUIP) 1 MG tablet Take 3 mg by mouth at bedtime.   Yes [provider]  simvastatin (ZOCOR) 20 MG tablet Take 20 mg by mouth every evening.   Yes [provider]  Vitamin D, Ergocalciferol, (DRISDOL) 1.25 MG (50000 UNIT) CAPS capsule Take 50,000 Units by mouth every 7 (seven) days.   Yes [provider]  guaiFENesin (MUCINEX) 600 MG 12 hr tablet Take 2 tablets (1,200 mg total)  by mouth 2 (two) times daily. Patient not taking: Reported on 05/16/2020 02/02/17   Elgergawy, Silver Huguenin, MD  HYDROcodone-acetaminophen (NORCO) 7.5-325 MG tablet Take 1-2 tablets by mouth every 4 (four) hours as needed for moderate pain. Maximum dose per 24 hours - 8 pills Patient not taking: Reported on 04/27/2020 10/03/15   Kathie Rhodes, MD   No Known Allergies Review of Systems  Unable to perform ROS: Dementia    Physical Exam Vitals reviewed.  Constitutional:      Appearance: He is cachectic.  HENT:     Head:     Comments: Bruising present over left eye Pulmonary:     Effort: Pulmonary effort is normal.     Comments: O2 at 3L via nasal cannula Neurological:     Mental Status: He is alert. He is confused.     Comments: Oriented to self only    Vital Signs: BP (!) 115/54 (BP Location: Left Arm)   Pulse 65   Temp 97.8 F (36.6 C) (Oral)   Resp 20   Wt 61.7 kg   SpO2 97%   BMI 19.51 kg/m  Pain Scale: Faces     SpO2: SpO2: 97 % O2 Device:SpO2: 97 % O2 Flow Rate: .O2 Flow Rate (L/min): 3 L/min  IO: Intake/output summary:   Intake/Output Summary (Last 24 hours) at 04/25/2020 9326 Last data filed at 04/25/2020 7124 Gross per 24 hour  Intake 243 ml  Output 500 ml  Net -257 ml    LBM: Last BM Date:  (PTA) Baseline  Weight: Weight: 61.7 kg Most recent weight: Weight: 61.7 kg      Palliative Assessment/Data: 20%    Time In: 13:00 Time Out: 14:10 Time Total: 70 minutes Greater than 50%  of this time was spent counseling and coordinating care related to the above assessment and plan.  Signed by: Lavena Bullion, NP   Please contact Palliative Medicine Team phone at [phone] for questions and concerns.  For individual provider: See Shea Evans

## 2020-04-25 NOTE — Progress Notes (Signed)
Notified by CCMD that patient had a 2.43 seconds pause and HR 38 nonsustained. Patient is asymptomatic. Will continue to monitor patient,.

## 2020-04-25 NOTE — Progress Notes (Signed)
Initial Nutrition Assessment  DOCUMENTATION CODES:   Severe malnutrition in context of chronic illness  INTERVENTION:   Liberalize diet to Regular  Ensure Enlive po TID, each supplement provides 350 kcal and 20 grams of protein  Magic cup TID with meals, each supplement provides 290 kcal and 9 grams of protein   NUTRITION DIAGNOSIS:   Severe Malnutrition related to chronic illness (CAD, COPD, dementia) as evidenced by severe muscle depletion, severe fat depletion, percent weight loss, energy intake < or equal to 75% for > or equal to 1 month.  GOAL:   Patient will meet greater than or equal to 90% of their needs  MONITOR:   PO intake, Supplement acceptance  REASON FOR ASSESSMENT:   Consult Assessment of nutrition requirement/status  ASSESSMENT:   Pt with PMH of CAD s/p CABG, chronic Afib, aortic stenosis, HTN, sleep apnea, COPD, and dementia admitted with acute on chronic systolic CHF.   Cardiology consulted; recommend comfort care.   Spoke with pt and his daughter/son-in-law who he lives with. She reports that pt lost about 10 lb per month for the last 6 months. He was 183 lb and has lost down to 135 lb. 26% weight loss in the last 6 months per daughter. She reports that he has been less mobile and that his PCP felt weight loss was due to old age and early dementia. He only eats bites throughout the day and a small portion at dinner. He does drink one Boost per day and likes chocolate. She reports that pt does need to be fed and cannot feed himself.    Medications reviewed and include: vitamin D3, marinol, lasix, lovaza,  Labs reviewed    NUTRITION - FOCUSED PHYSICAL EXAM:    Most Recent Value  Orbital Region Severe depletion  Upper Arm Region Severe depletion  Thoracic and Lumbar Region Severe depletion  Buccal Region Severe depletion  Temple Region Severe depletion  Clavicle Bone Region Severe depletion  Clavicle and Acromion Bone Region Severe depletion   Scapular Bone Region Severe depletion  Dorsal Hand Severe depletion  Patellar Region Severe depletion  Anterior Thigh Region Severe depletion  Posterior Calf Region Severe depletion  Edema (RD Assessment) None  Hair Reviewed  Eyes Reviewed  Mouth Reviewed  Skin Reviewed  Nails Reviewed       Diet Order:   Diet Order            Diet regular Room service appropriate? Yes with Assist; Fluid consistency: Thin; Fluid restriction: 1500 mL Fluid  Diet effective now                 EDUCATION NEEDS:   No education needs have been identified at this time  Skin:  Skin Assessment: Reviewed RN Assessment  Last BM:  unknown  Height:   Ht Readings from Last 1 Encounters:  04/25/20 5\' 10"  (1.778 m)    Weight:   Wt Readings from Last 1 Encounters:  04/25/20 61.7 kg    Ideal Body Weight:  75.4 kg  BMI:  Body mass index is 19.51 kg/m.  Estimated Nutritional Needs:   Kcal:  1800-2000  Protein:  90-105 grams  Fluid:  >1.5 L/day  06/26/20., RD, LDN, CNSC See AMiON for contact information

## 2020-04-25 NOTE — TOC Initial Note (Signed)
Transition of Care [facility]) - Initial/Assessment Note    Patient Details  Name: Austin Roberts MRN: [id number] Date of Birth: [birth year]  Transition of Care [facility]) CM/SW Contact:    Terrial Rhodes, LCSWA Phone Number: 04/25/2020, 4:42 PM  Clinical Narrative:                   CSW left voicemail for patients daughter to call CSW back. CSW reached out to Trena Platt to reach out to patients daughter for possible residential hospice with [facility]. CSW will follow up with patients daughter tomorrow.  CSW will continue to follow   Expected Discharge Plan: Hospice Medical Facility Barriers to Discharge: Continued Medical Work up   Patient Goals and CMS Choice Patient states their goals for this hospitalization and ongoing recovery are:: to go to residential hospice      Expected Discharge Plan and Services Expected Discharge Plan: Hospice Medical Facility                                              Prior Living Arrangements/Services     Patient language and need for interpreter reviewed:: Yes Do you feel safe going back to the place where you live?: No   to go to residential hospice  Need for Family Participation in Patient Care: Yes (Comment) Care giver support system in place?: Yes (comment)   Criminal Activity/Legal Involvement Pertinent to Current Situation/Hospitalization: No - Comment as needed  Activities of Daily Living      Permission Sought/Granted Permission sought to share information with : Case Manager, Family Supports, Magazine features editor                Emotional Assessment       Orientation: : Oriented to Self Alcohol / Substance Use: Not Applicable Psych Involvement: No (comment)  Admission diagnosis:  CHF (congestive heart failure) (HCC) [I50.9] Patient Active Problem List   Diagnosis Date Noted  . Acute CHF (congestive heart failure) (HCC) 05-14-2020  . CHF (congestive heart failure) (HCC) 14-May-2020  . PNA  (pneumonia) 02/02/2017  . Hypokalemia 02/01/2017  . Hypertension 02/01/2017  . Coronary artery disease 02/01/2017  . Asthma 02/01/2017  . Hyperlipidemia 02/01/2017  . Sleep apnea 02/01/2017  . Arthritis 02/01/2017  . Chronic atrial fibrillation (HCC) 02/01/2017   PCP:  Aida Puffer, MD Pharmacy:   [facility] - [place] - [address] [address] [address] [address] [place] [address] Phone: [phone] Fax: [phone]     Social Determinants of Health (SDOH) Interventions    Readmission Risk Interventions No flowsheet data found.

## 2020-04-25 NOTE — Progress Notes (Addendum)
Progress Note  Patient Name: Austin LarsenRichard Roberts Date of Encounter: 04/25/2020  [facility]: New  Subjective   No CP   Breathing is OK  " I want a 7 UP"  Inpatient Medications    Scheduled Meds: . aspirin EC  81 mg Oral Daily  . budesonide  0.5 mg Nebulization BID  . cholecalciferol  1,000 Units Oral Daily  . dronabinol  2.5 mg Oral BID AC  . furosemide  20 mg Intravenous BID  . heparin  5,000 Units Subcutaneous Q8H  . mouth rinse  15 mL Mouth Rinse BID  . omega-3 acid ethyl esters  1 g Oral Daily  . simvastatin  40 mg Oral QHS  . sodium chloride flush  3 mL Intravenous Q12H   Continuous Infusions: . sodium chloride     PRN Meds: sodium chloride, acetaminophen, docusate sodium, ondansetron (ZOFRAN) IV, polyvinyl alcohol, sodium chloride flush   Vital Signs    Vitals:   05/04/2020 2353 04/25/20 0456 04/25/20 0740 04/25/20 0743  BP: (!) 103/43   (!) 115/54  Pulse:    65  Resp: 20   20  Temp: 97.7 F (36.5 C) (!) 97.4 F (36.3 C)  97.8 F (36.6 C)  TempSrc: Oral Oral  Oral  SpO2:   96% 97%  Weight:  61.7 kg      Intake/Output Summary (Last 24 hours) at 04/25/2020 0924 Last data filed at 04/25/2020 [id number] Gross per 24 hour  Intake 243 ml  Output 500 ml  Net -257 ml   Last 3 Weights 04/25/2020 02/01/2017 10/03/2015  Weight (lbs) 136 lb 180 lb 1.6 oz 171 lb  Weight (kg) 61.689 kg 81.693 kg 77.565 kg      Telemetry    Afub with occasional PVC   Rates 30s to 60s  - Personally Reviewed  ECG    No new - Personally Reviewed  Physical Exam  Frail 86 yo in NAD  GEN: No acute distress.   Neck: Mild elev JVP   Cardiac: Irreg irreg ,  II- III/VI later peaking systolic murmur across precordium  .  Respiratory: Clear to auscultation with decreased at bases   GI: Soft, nontender, non-distended  MS: No edema; No deformity. Neuro:  Deferred     Labs    High Sensitivity Troponin:  No results for input(s): TROPONINIHS in the last 720 hours.     Chemistry Recent Labs  Lab 05/13/2020 1355 04/25/20 0410  NA 142 142  K 3.8 3.6  CL 102 101  CO2 27 32  GLUCOSE 160* 123*  BUN 26* 25*  CREATININE 1.64* 1.62*  CALCIUM 9.6 9.3  GFRNONAA 37* 37*  GFRAA 43* 43*  ANIONGAP 13 9     Hematology Recent Labs  Lab 05/03/2020 1355  WBC 9.2  RBC 3.66*  HGB 11.5*  HCT 36.2*  MCV 98.9  MCH 31.4  MCHC 31.8  RDW 14.8  PLT 157    BNPNo results for input(s): BNP, PROBNP in the last 168 hours.   DDimer No results for input(s): DDIMER in the last 168 hours.   Radiology    DG Chest 2 View  Result Date: 05/09/2020 CLINICAL DATA:  86 year old male with a history of CHF EXAM: CHEST - 2 VIEW COMPARISON:  04/23/2020 chest CT 02/02/2017, plain from 02/02/2017 FINDINGS: Cardiomediastinal silhouette unchanged in size and contour with cardiomegaly. Heart borders partially obscured by overlying lung/pleural disease. Mixed interstitial and airspace opacities of the bilateral lungs, unchanged from the comparison. Meniscus at  the right lung base. Blunting of the left costophrenic angle. No evidence of pneumothorax. Surgical changes of median sternotomy and CABG. IMPRESSION: Similar appearance of the chest x-ray to the prior, with bilateral mixed interstitial and airspace disease and right greater than left pleural effusions. No evidence of pneumothorax on the current plain film. Surgical changes of median sternotomy and CABG, with cardiomegaly. Electronically Signed   By: Austin Roberts D.O.   On: 04/25/2020 13:50   US RENAL  Result Date: 05/18/2020 CLINICAL DATA:  Acute kidney injury EXAM: RENAL / URINARY TRACT ULTRASOUND COMPLETE COMPARISON:  Same day chest radiograph. Renal ultrasound dated 12/12/2015. FINDINGS: Right Kidney: Renal measurements: 9.4 x 4.4 x 5.0 cm = volume: 103 mL . Echogenicity is increased. A 6 mm nonobstructive calculus is seen. No mass or hydronephrosis visualized. Left Kidney: Renal measurements: 10.8 x 5.3 x 6.6 cm = volume: 200  mL. Echogenicity is increased. There are multiple ureteral calculi seen in the mid ureter measuring up to 2 cm with resulting severe hydronephrosis. Multiple renal calculi are seen, measuring up to 1.1 cm. No mass is visualized. Bladder: Appears normal for degree of bladder distention. Other: A right pleural effusion is seen. IMPRESSION: Multiple obstructing left ureteral calculi resulting in severe hydronephrosis. Electronically Signed   By: Romona Curls M.D.   On: 05/13/2020 21:09   ECHOCARDIOGRAM COMPLETE  Result Date: 04/21/2020    ECHOCARDIOGRAM REPORT   Patient Name:   Austin Roberts Date of Exam: 04/28/2020 Medical Rec #:  [id number]       Height:       70.0 in Accession #:    [id number]      Weight:       180.1 lb Date of Birth:  01/26/1929      BSA:          1.996 m Patient Age:    86 years        BP:           125/53 mmHg Patient Gender: M               HR:           70 bpm. Exam Location:  [place] Procedure: 2D Echo, Cardiac Doppler and Color Doppler Indications:    CHF-Acute Systolic  History:        Patient has no prior history of Echocardiogram examinations. CAD                 and Previous Myocardial Infarction, Prior CABG, COPD,                 Arrythmias:Atrial Fibrillation; Risk Factors:Dyslipidemia, Sleep                 Apnea and Former Smoker.  Sonographer:     Austin Roberts RDCS (AE) Referring Phys: [id number] Emeline General IMPRESSIONS  1. Left ventricular ejection fraction, by estimation, is 40 to 45%. The left ventricle has mildly decreased function. The left ventricle demonstrates global hypokinesis. There is moderate left ventricular hypertrophy. Left ventricular diastolic parameters are indeterminate.  2. Right ventricular systolic function is mildly reduced. The right ventricular size is mildly enlarged. There is moderately elevated pulmonary artery systolic pressure. The estimated right ventricular systolic pressure is 45.8 mmHg.  3. Left atrial size was moderately dilated.  4. Right  atrial size was mildly dilated.  5. The mitral valve is degenerative. Mild to moderate mitral valve regurgitation.  6. Tricuspid valve regurgitation is mild to moderate.  7. The aortic valve is tricuspid, severely calcified. Aortic valve regurgitation is mild. Severe, low gradient aortic valve stenosis. Aortic valve mean gradient measures 25.0 mmHg. Aortic valve Vmax measures 3.45 m/s. Dimentionless index 0.18.  8. The inferior vena cava is normal in size with greater than 50% respiratory variability, suggesting right atrial pressure of 3 mmHg. FINDINGS  Left Ventricle: Left ventricular ejection fraction, by estimation, is 40 to 45%. The left ventricle has mildly decreased function. The left ventricle demonstrates global hypokinesis. The left ventricular internal cavity size was normal in size. There is  moderate left ventricular hypertrophy. Left ventricular diastolic parameters are indeterminate. Right Ventricle: The right ventricular size is mildly enlarged. No increase in right ventricular wall thickness. Right ventricular systolic function is mildly reduced. There is moderately elevated pulmonary artery systolic pressure. The tricuspid regurgitant velocity is 3.27 m/s, and with an assumed right atrial pressure of 3 mmHg, the estimated right ventricular systolic pressure is 45.8 mmHg. Left Atrium: Left atrial size was moderately dilated. Right Atrium: Right atrial size was mildly dilated. Pericardium: There is no evidence of pericardial effusion. Mitral Valve: The mitral valve is degenerative in appearance. Moderate mitral annular calcification. Mild to moderate mitral valve regurgitation. Tricuspid Valve: The tricuspid valve is grossly normal. Tricuspid valve regurgitation is mild to moderate. Aortic Valve: The aortic valve is tricuspid. Aortic valve regurgitation is mild. Aortic regurgitation PHT measures 362 msec. Severe aortic stenosis is present. Mild to moderate aortic valve annular calcification. There is  severe calcifcation of the aortic valve. Aortic valve mean gradient measures 25.0 mmHg. Aortic valve peak gradient measures 47.7 mmHg. Aortic valve area, by VTI measures 0.52 cm. Pulmonic Valve: The pulmonic valve was grossly normal. Pulmonic valve regurgitation is trivial. Aorta: The aortic root is normal in size and structure. Venous: The inferior vena cava is normal in size with greater than 50% respiratory variability, suggesting right atrial pressure of 3 mmHg. IAS/Shunts: No atrial level shunt detected by color flow Doppler. Additional Comments: There is pleural effusion in the left lateral region.  LEFT VENTRICLE PLAX 2D LVIDd:         4.20 cm LVIDs:         4.10 cm LV PW:         1.60 cm LV IVS:        1.60 cm LVOT diam:     1.90 cm LV SV:         39 LV SV Index:   20 LVOT Area:     2.84 cm  LV Volumes (MOD) LV vol d, MOD A2C: 107.0 ml LV vol d, MOD A4C: 72.4 ml LV vol s, MOD A2C: 56.4 ml LV vol s, MOD A4C: 62.9 ml LV SV MOD A2C:     50.6 ml LV SV MOD A4C:     72.4 ml LV SV MOD BP:      30.2 ml RIGHT VENTRICLE            IVC RV Basal diam:  4.00 cm    IVC diam: 1.70 cm RV Mid diam:    3.50 cm RV S prime:     5.43 cm/s TAPSE (M-mode): 1.1 cm LEFT ATRIUM             Index       RIGHT ATRIUM           Index LA diam:        4.80 cm 2.40 cm/m  RA Area:     21.90 cm  LA Vol (A2C):   85.2 ml 42.68 ml/m RA Volume:   66.60 ml  33.36 ml/m LA Vol (A4C):   88.2 ml 44.18 ml/m LA Biplane Vol: 87.0 ml 43.58 ml/m  AORTIC VALVE AV Area (Vmax):    0.44 cm AV Area (Vmean):   0.47 cm AV Area (VTI):     0.52 cm AV Vmax:           345.40 cm/s AV Vmean:          225.200 cm/s AV VTI:            0.758 m AV Peak Grad:      47.7 mmHg AV Mean Grad:      25.0 mmHg LVOT Vmax:         53.16 cm/s LVOT Vmean:        37.320 cm/s LVOT VTI:          0.139 m LVOT/AV VTI ratio: 0.18 AI PHT:            362 msec  AORTA Ao Root diam: 3.60 cm Ao Asc diam:  3.40 cm TRICUSPID VALVE TR Peak grad:   42.8 mmHg TR Vmax:        327.00 cm/s   SHUNTS Systemic VTI:  0.14 m Systemic Diam: 1.90 cm Nona Dell MD Electronically signed by Nona Dell MD Signature Date/Time: 05/13/2020/4:59:47 PM    Final     Cardiac Studies   Echo7/4/21  1. Left ventricular ejection fraction, by estimation, is 40 to 45%. The left ventricle has mildly decreased function. The left ventricle demonstrates global hypokinesis. There is moderate left ventricular hypertrophy. Left ventricular diastolic parameters are indeterminate. 2. Right ventricular systolic function is mildly reduced. The right ventricular size is mildly enlarged. There is moderately elevated pulmonary artery systolic pressure. The estimated right ventricular systolic pressure is 45.8 mmHg. 3. Left atrial size was moderately dilated. 4. Right atrial size was mildly dilated. 5. The mitral valve is degenerative. Mild to moderate mitral valve regurgitation. 6. Tricuspid valve regurgitation is mild to moderate. 7. The aortic valve is tricuspid, severely calcified. Aortic valve regurgitation is mild. Severe, low gradient aortic valve stenosis. Aortic valve mean gradient measures 25.0 mmHg. Aortic valve Vmax measures 3.45 m/s. Dimentionless index 0.18. 8. The inferior vena cava is normal in size with greater than 50% respiratory variability, suggesting right atrial pressure of 3 mmHg.   Patient Profile     Graden Hoshino is an 86 y.o. male with a history of CAD status post CABG approximately 10 years ago, permanent atrial fibrillation, hypertension, OSA, COPD, dementia with failure to thrive, and possibly aortic stenosis who is being seen today for the evaluation of possible aortic stenosis and congestive heart failure at the request of Dr Chipper Herb.   Assessment & Plan   1  CAD  Pt with trivial elevation of troponin   He denies CP   Just SOB [facility] Echo yesterday noted above   LVEF 40 to 45%   With severe AS  2   Acute on chronic systolic CHF    Pt with some volume increase on exam    He received 20 mg lasix IV yesterday without response    I would give 80 IV once and follow response   This will need to be on as needed basis with follow of response    Low Na diet    3  Aortic stenosis.  I have reviewed echo  Appears severe   Mean gradient is not severely elevated by LVOT/AV VTI is 0.18 consistent with severe AS    2 D images reflect this  I do not think patient is candidate for interventions   Would try to balance medically   4   AFib   Chronic   Rates low on no  meds   Not on anticoagulation given falls    5  Renal   Cr 1.6 on admit   Watch closely  With lasix x1    6  Cachexia /FTT.  PT says he is never hungry    7   Dispo   I agree with contacting palliative care.   With current situation and multiple medical issues that cannot be corrected, I support goals of comfort care   I have been unable to reach daughter by phone    For questions or updates, please contact [facility] Please consult [email or web address] for contact info under        Signed, Dietrich Pates, MD  04/25/2020, 9:24 AM

## 2020-04-25 NOTE — Progress Notes (Signed)
Unable to do ordered orthostatics vitals because patient is not able to stand at this time.

## 2020-04-25 NOTE — Evaluation (Signed)
Physical Therapy Evaluation Patient Details Name: Austin Roberts MRN: [id number] DOB: 1929-09-14 Today's Date: 04/25/2020   History of Present Illness  86 y.o. male with medical history significant of CAD status post CABG 10 years ago, chronic A. fib aortic stenosis, hypertension, sleep apnea, question of COPD, early dementia with failure to thrivepresented with fall. In [facility] ED found to have bilateral pleural effusion and pulmonary congestion. Admitted to [facility] 04/28/2020 for acute on chronic CHF decompensation plus aortic stenosis decompensation.   Clinical Impression  PTA pt living with daughter and son in-law in single story home with 3 steps to enter. Daughter relays that pt ambulates in the home with RW and very rarely goes out. Pt has requires assist for getting into the bath tub and son in-law supervises bathing. Pt has been independent in dressing however lately has done things like put his underware on over his pants. Family provides for iADLs. Pt is currently limited in safe mobility by decreased cognition, and failure to thrive which contributes to his generalized deconditioning and decreased balance. Pt requires modA for bed mobility and transfers with and without AD, and heavy mod A for stepping from bed to recliner. Once in recliner pt request to get to [facility]. Pt became incontinent of stool on transfer to [facility]. Pt requires mod-max assist to maintiain standing for pericare before transferring back to recliner. PT recommending SNF level rehab at discharge to improve strength and balance. PT will continue to follow acutely.      Follow Up Recommendations SNF    Equipment Recommendations  Other (comment) (TBD at next venue)    Recommendations for Other Services OT consult     Precautions / Restrictions Precautions Precautions: Fall Precaution Comments: fall prior to hospitalization  Restrictions Weight Bearing Restrictions: No      Mobility  Bed Mobility Overal bed  mobility: Needs Assistance Bed Mobility: Supine to Sit     Supine to sit: Mod assist     General bed mobility comments: pt able to move LE to EOB requires increased cuing and modA for reaching to bed rail and bringing trunk to upright, able to scoot hips to EoB    Transfers Overall transfer level: Needs assistance Equipment used: Rolling walker (2 wheeled);1 person hand held assist Transfers: Sit to/from [employer] Sit to Stand: Mod assist Stand pivot transfers: Mod assist       General transfer comment: able to initate power up but requires mod A for coming to upright and steadying with RW, modA for power up and steadying with pivot transfer to/from [facility]  Ambulation/Gait Ambulation/Gait assistance: Mod assist Gait Distance (Feet): 3 Feet Assistive device: Rolling walker (2 wheeled) Gait Pattern/deviations: Step-to pattern;Shuffle;Trunk flexed;Narrow base of support Gait velocity: slow Gait velocity interpretation: <1.31 ft/sec, indicative of household ambulator General Gait Details: modA for steadying with RW to ambulate from bed to recliner, short, shuffling steps, vc for proximity to RW, unable to follow         Balance Overall balance assessment: Needs assistance Sitting-balance support: Feet supported;No upper extremity supported Sitting balance-Leahy Scale: Poor     Standing balance support: Bilateral upper extremity supported Standing balance-Leahy Scale: Poor                               Pertinent Vitals/Pain Pain Assessment: Faces Faces Pain Scale: Hurts a little bit Pain Location: generalized in suping Pain Descriptors / Indicators: Grimacing Pain Intervention(s): Limited  activity within patient's tolerance;Monitored during session;Repositioned    Home Living Family/patient expects to be discharged to:: Private residence Living Arrangements: Children Available Help at Discharge: Family Type of Home: House Home Access:  Stairs to enter   Secretary/administrator of Steps: 3 Home Layout: One level Home Equipment: Environmental consultant - 2 wheels;Grab bars - tub/shower      Prior Function Level of Independence: Needs assistance   Gait / Transfers Assistance Needed: household ambulation with RW  ADL's / Homemaking Assistance Needed: son in law assists with transfer into shower and supervision, able to dress but has placed underware over sweatpants and forgotten pants all together           Extremity/Trunk Assessment   Upper Extremity Assessment Upper Extremity Assessment: Generalized weakness    Lower Extremity Assessment Lower Extremity Assessment: Generalized weakness    Cervical / Trunk Assessment Cervical / Trunk Assessment: Kyphotic  Communication   Communication: HOH  Cognition Arousal/Alertness: Awake/alert Behavior During Therapy: WFL for tasks assessed/performed Overall Cognitive Status: History of cognitive impairments - at baseline                                 General Comments: PLOF and hx provided by daughter, pt requires increased time and cuing for mobility       General Comments General comments (skin integrity, edema, etc.): Daughter and son in-law in room, VSS on 3L O2 via [place], IV leaking with transfer to recliner         Assessment/Plan    PT Assessment Patient needs continued PT services  PT Problem List Decreased strength;Decreased activity tolerance;Decreased balance;Decreased mobility;Decreased cognition;Decreased coordination;Decreased knowledge of use of DME;Decreased safety awareness;Cardiopulmonary status limiting activity       PT Treatment Interventions DME instruction;Gait training;Functional mobility training;Therapeutic activities;Therapeutic exercise;Balance training;Cognitive remediation;Patient/family education    PT Goals (Current goals can be found in the Care Plan section)  Acute Rehab PT Goals Patient Stated Goal: none stated PT Goal  Formulation: With patient Time For Goal Achievement: 05/09/20 Potential to Achieve Goals: Fair    Frequency Min 2X/week    AM-PAC PT "6 Clicks" Mobility  Outcome Measure Help needed turning from your back to your side while in a flat bed without using bedrails?: A Little Help needed moving from lying on your back to sitting on the side of a flat bed without using bedrails?: A Lot Help needed moving to and from a bed to a chair (including a wheelchair)?: A Lot Help needed standing up from a chair using your arms (e.g., wheelchair or bedside chair)?: A Lot Help needed to walk in hospital room?: Total Help needed climbing 3-5 steps with a railing? : Total 6 Click Score: 11    End of Session Equipment Utilized During Treatment: Gait belt;Oxygen Activity Tolerance: Patient tolerated treatment well Patient left: in chair;with call bell/phone within reach;with chair alarm set Nurse Communication: Mobility status;Other (comment) (leaking IV) PT Visit Diagnosis: Unsteadiness on feet (R26.81);Other abnormalities of gait and mobility (R26.89);Muscle weakness (generalized) (M62.81);Difficulty in walking, not elsewhere classified (R26.2)    Time: 1418-1500 PT Time Calculation (min) (ACUTE ONLY): 42 min   Charges:   PT Evaluation $PT Eval Moderate Complexity: 1 Mod PT Treatments $Therapeutic Activity: 23-37 mins        Hailynn Slovacek B. Beverely Risen PT, DPT Acute Rehabilitation Services Pager [phone] Office [phone]   Elon Alas Fleet 04/25/2020, 3:16 PM

## 2020-04-25 NOTE — Progress Notes (Signed)
   2020/05/18 1030  Assess: MEWS Score  Level of Consciousness Alert  Assess: MEWS Score  MEWS Temp 0  MEWS Systolic 0  MEWS Pulse 0  MEWS RR 2  MEWS LOC 0  MEWS Score 2  MEWS Score Color Yellow  Assess: if the MEWS score is Yellow or Red  Were vital signs taken at a resting state? Yes  Focused Assessment Documented focused assessment  Early Detection of Sepsis Score *See Row Information* Low  MEWS guidelines implemented *See Row Information* Yes  Treat  MEWS Interventions Escalated (See documentation below)  Take Vital Signs  Increase Vital Sign Frequency  Yellow: Q 2hr X 2 then Q 4hr X 2, if remains yellow, continue Q 4hrs  Escalate  MEWS: Escalate Yellow: discuss with charge nurse/RN and consider discussing with provider and RRT  Notify: Charge Nurse/RN  Name of Charge Nurse/RN Notified Shanda Bumps RN  Date Charge Nurse/RN Notified 2020/05/18  Time Charge Nurse/RN Notified 1154  Notify: Provider  Provider Name/Title Zhang  Date Provider Notified 05/18/20  Time Provider Notified 1154  Notification Type Page  Notification Reason Change in status  Response See new orders  Date of Provider Response 05/18/2020  Time of Provider Response 1157  Document  Patient Outcome Other (Comment) (No interventions MD at bedside)  Progress note created (see row info) Yes

## 2020-04-25 NOTE — Care Management (Signed)
04-25-20 1318 Case Manager spoke with family regarding [facility] notification. Family had not called to make the [place] aware. Case Manager did call the [place] to make them aware of admission to place in their system. Per daughter patient is not being seen at any [facility] at this time. Prior to admission patient was from home with the support of daughter [place] in [place]. Daughter states patient has durable medical equipment cane and rolling walker in the home. Patient uses [facility] and PCP is Allyn Kenner @ [facility]. Palliative has been consulted. Case Manager will continue to follow for additional transition of care needs. Graves-Bigel[place], Lamar Laundry, RN, BSN Case Manager

## 2020-04-26 DIAGNOSIS — R410 Disorientation, unspecified: Secondary | ICD-10-CM

## 2020-04-26 DIAGNOSIS — E43 Unspecified severe protein-calorie malnutrition: Secondary | ICD-10-CM | POA: Insufficient documentation

## 2020-04-26 DIAGNOSIS — R0600 Dyspnea, unspecified: Secondary | ICD-10-CM

## 2020-04-26 DIAGNOSIS — N1832 Chronic kidney disease, stage 3b: Secondary | ICD-10-CM

## 2020-04-26 LAB — BASIC METABOLIC PANEL
Anion gap: 13 (ref 5–15)
BUN: 37 mg/dL — ABNORMAL HIGH (ref 8–23)
CO2: 31 mmol/L (ref 22–32)
Calcium: 9.5 mg/dL (ref 8.9–10.3)
Chloride: 95 mmol/L — ABNORMAL LOW (ref 98–111)
Creatinine, Ser: 1.86 mg/dL — ABNORMAL HIGH (ref 0.61–1.24)
GFR calc Af Amer: 37 mL/min — ABNORMAL LOW (ref >60)
GFR calc non Af Amer: 32 mL/min — ABNORMAL LOW (ref >60)
Glucose, Bld: 256 mg/dL — ABNORMAL HIGH (ref 70–99)
Potassium: 3.7 mmol/L (ref 3.5–5.1)
Sodium: 139 mmol/L (ref 135–145)

## 2020-04-26 LAB — MAGNESIUM: Magnesium: 1.9 mg/dL (ref 1.7–2.4)

## 2020-04-26 MED ORDER — POLYVINYL ALCOHOL 1.4 % OP SOLN
1.0000 [drp] | Freq: Four times a day (QID) | OPHTHALMIC | Status: DC | PRN
  Filled 2020-04-26: qty 15

## 2020-04-26 MED ORDER — LORAZEPAM 2 MG/ML IJ SOLN
1.0000 mg | INTRAMUSCULAR | Status: DC | PRN
  Administered 2020-04-26: 1 mg via INTRAVENOUS
  Filled 2020-04-26: qty 1

## 2020-04-26 MED ORDER — ACETAMINOPHEN 325 MG PO TABS: 650.0000 mg | ORAL_TABLET | Freq: Four times a day (QID) | ORAL | Status: DC | PRN

## 2020-04-26 MED ORDER — ACETAMINOPHEN 650 MG RE SUPP: 650.0000 mg | Freq: Four times a day (QID) | RECTAL | Status: DC | PRN

## 2020-04-26 MED ORDER — ONDANSETRON HCL 4 MG/2ML IJ SOLN: 4.0000 mg | Freq: Four times a day (QID) | INTRAMUSCULAR | Status: DC | PRN

## 2020-04-26 MED ORDER — SODIUM CHLORIDE 0.9 % IV SOLN
1.0000 mg/h | INTRAVENOUS | Status: DC
  Administered 2020-04-26: 1 mg/h via INTRAVENOUS
  Filled 2020-04-26: qty 5

## 2020-04-26 MED ORDER — LORAZEPAM 1 MG PO TABS: 1.0000 mg | ORAL_TABLET | ORAL | Status: DC | PRN

## 2020-04-26 MED ORDER — ONDANSETRON 4 MG PO TBDP
4.0000 mg | ORAL_TABLET | Freq: Four times a day (QID) | ORAL | Status: DC | PRN
  Filled 2020-04-26: qty 1

## 2020-04-26 MED ORDER — HYDROMORPHONE BOLUS VIA INFUSION
0.5000 mg | INTRAVENOUS | Status: DC | PRN
  Administered 2020-04-26: 0.5 mg via INTRAVENOUS
  Filled 2020-04-26: qty 1

## 2020-04-26 MED ORDER — HALOPERIDOL LACTATE 5 MG/ML IJ SOLN: 1.0000 mg | Freq: Four times a day (QID) | INTRAMUSCULAR | Status: DC | PRN

## 2020-04-26 MED ORDER — QUETIAPINE FUMARATE 25 MG PO TABS: 12.5000 mg | ORAL_TABLET | Freq: Every day | ORAL | Status: DC

## 2020-04-26 MED ORDER — MORPHINE SULFATE (CONCENTRATE) 10 MG/0.5ML PO SOLN
5.0000 mg | ORAL | Status: DC
  Administered 2020-04-26: 5 mg via SUBLINGUAL
  Filled 2020-04-26: qty 0.5

## 2020-04-26 MED ORDER — HYDROMORPHONE HCL 1 MG/ML IJ SOLN
0.5000 mg | INTRAMUSCULAR | Status: DC | PRN
  Administered 2020-04-26: 0.5 mg via INTRAVENOUS
  Filled 2020-04-26: qty 1

## 2020-04-26 MED ORDER — LORAZEPAM 1 MG PO TABS
1.0000 mg | ORAL_TABLET | Freq: Two times a day (BID) | ORAL | Status: DC
  Administered 2020-04-26: 1 mg via ORAL
  Filled 2020-04-26: qty 1

## 2020-04-26 MED ORDER — BIOTENE DRY MOUTH MT LIQD: 15.0000 mL | OROMUCOSAL | Status: DC | PRN

## 2020-04-26 MED ORDER — GLYCOPYRROLATE 0.2 MG/ML IJ SOLN: 0.2000 mg | INTRAMUSCULAR | Status: DC | PRN

## 2020-04-27 DIAGNOSIS — R0600 Dyspnea, unspecified: Secondary | ICD-10-CM

## 2020-04-27 DIAGNOSIS — N179 Acute kidney failure, unspecified: Secondary | ICD-10-CM

## 2020-05-22 NOTE — TOC Progression Note (Signed)
Transition of Care [facility]) - Progression Note    Patient Details  Name: Austin Roberts MRN: [id number] Date of Birth: 1928/12/20  [facility] CM/SW Contact  Terrial Rhodes, LCSWA Phone Number: 05/06/2020, 1:21 PM  Clinical Narrative:     Update 1:23pm -CSW spoke with physician who said patient has been transitioned to full comfort measures. Amil Amen NP will reach out to Hospice to reconsider their decision for residential hospice for patient.  CSW team will continue to follow.    CSW spoke with Chrislyn who let CSW know that patient is not eligible at this time for IPU due to expected prognosis likely better than two weeks. CSW tried to call patients daughter [place] to discuss plan for patient. CSW awaiting call back.   Expected Discharge Plan: Hospice Medical Facility Barriers to Discharge: Continued Medical Work up  Expected Discharge Plan and Services Expected Discharge Plan: Hospice Medical Facility                                               Social Determinants of Health (SDOH) Interventions    Readmission Risk Interventions No flowsheet data found.

## 2020-05-22 NOTE — Progress Notes (Signed)
Civil engineer, contracting ([employer]) liaison note  [employer] MD deemed patient not eligible at this time for IPU due to expected prognosis likely better than two weeks.  This RN left a voicemail for daughter [place] to discuss other options, ie home with hospice.  Contact information provided in voicemail. This RN will follow up later today.  Please do not hesitate to reach out with ay questions or concerns.  Gillian Scarce, BSN, RN Civil engineer, contracting (in [place]- hospice) [phone] (24h on call)

## 2020-05-22 NOTE — Progress Notes (Addendum)
Progress Note  Patient Name: Austin Roberts Date of Encounter: 05/07/2020  Primary Cardiologist: Tenny Crawoss   Subjective   Resting this AM. Up to chair   Inpatient Medications    Scheduled Meds: . aspirin EC  81 mg Oral Daily  . budesonide  0.5 mg Nebulization BID  . cholecalciferol  1,000 Units Oral Daily  . dronabinol  2.5 mg Oral BID AC  . feeding supplement (ENSURE ENLIVE)  237 mL Oral TID BM  . heparin  5,000 Units Subcutaneous Q8H  . mouth rinse  15 mL Mouth Rinse BID  . morphine CONCENTRATE  5 mg Sublingual TID  . omega-3 acid ethyl esters  1 g Oral Daily  . simvastatin  40 mg Oral QHS  . sodium chloride flush  3 mL Intravenous Q12H   Continuous Infusions: . sodium chloride     PRN Meds: sodium chloride, acetaminophen, docusate sodium, haloperidol lactate, ondansetron (ZOFRAN) IV, polyvinyl alcohol, sodium chloride flush   Vital Signs    Vitals:   04/30/2020 0006 05/07/2020 0653 05/17/2020 0744 05/04/2020 0821  BP: (!) 134/55 132/86  (!) 115/50  Pulse:  80  70  Resp:    20  Temp: 98 F (36.7 C) 98 F (36.7 C)  98 F (36.7 C)  TempSrc: Oral Oral  Oral  SpO2:  90% 99% 100%  Weight:      Height:        Intake/Output Summary (Last 24 hours) at 05/19/2020 0857 Last data filed at 05/06/2020 0654 Gross per 24 hour  Intake --  Output 750 ml  Net -750 ml   Filed Weights   04/25/20 0456  Weight: 61.7 kg    Physical Exam   General: Frail, elderly, NAD Neck: Negative for carotid bruits. No JVD Lungs:Clear to ausculation bilaterally. Breathing is unlabored. Cardiovascular: Irregularly irregular  with S1 S2. + murmur Abdomen: Soft, non-tender, non-distended. No obvious abdominal masses. Extremities: No edema. Radial pulses 2+ bilaterally Neuro: Alert and oriented. No focal deficits. No facial asymmetry. MAE spontaneously. Psych: Responds to questions appropriately with normal affect.    Labs    Chemistry Recent Labs  Lab July 02, 2020 1355 04/25/20 0410  05/04/2020 0349  NA 142 142 139  K 3.8 3.6 3.7  CL 102 101 95*  CO2 27 32 31  GLUCOSE 160* 123* 256*  BUN 26* 25* 37*  CREATININE 1.64* 1.62* 1.86*  CALCIUM 9.6 9.3 9.5  GFRNONAA 37* 37* 32*  GFRAA 43* 43* 37*  ANIONGAP 13 9 13      Hematology Recent Labs  Lab July 02, 2020 1355  WBC 9.2  RBC 3.66*  HGB 11.5*  HCT 36.2*  MCV 98.9  MCH 31.4  MCHC 31.8  RDW 14.8  PLT 157    Cardiac EnzymesNo results for input(s): TROPONINI in the last 168 hours. No results for input(s): TROPIPOC in the last 168 hours.   BNPNo results for input(s): BNP, PROBNP in the last 168 hours.   DDimer No results for input(s): DDIMER in the last 168 hours.   Radiology    DG Chest 1 View  Result Date: 04/25/2020 CLINICAL DATA:  Patient admitted 03-20-2020 after a fall. History of chronic heart failure. EXAM: CHEST  1 VIEW COMPARISON:  Single-view of the chest 03-20-2020. PA and lateral chest and CT chest 02/02/2017. FINDINGS: Extensive airspace disease throughout the right chest and a right pleural effusion have worsened since the most recent exam. Smaller left pleural effusion and left basilar airspace disease appear unchanged. There is  cardiomegaly. The patient is status post CABG. No pneumothorax. IMPRESSION: Right pleural effusion and extensive airspace disease throughout the right chest likely due to pneumonia have worsened since the most recent study. No change in a small left pleural effusion and basilar airspace disease. Cardiomegaly. Electronically Signed   By: Drusilla Kanner M.D.   On: 04/25/2020 12:22   DG Chest 2 View  Result Date: 2020-05-10 CLINICAL DATA:  86 year old male with a history of CHF EXAM: CHEST - 2 VIEW COMPARISON:  04/23/2020 chest CT 02/02/2017, plain from 02/02/2017 FINDINGS: Cardiomediastinal silhouette unchanged in size and contour with cardiomegaly. Heart borders partially obscured by overlying lung/pleural disease. Mixed interstitial and airspace opacities of the bilateral lungs,  unchanged from the comparison. Meniscus at the right lung base. Blunting of the left costophrenic angle. No evidence of pneumothorax. Surgical changes of median sternotomy and CABG. IMPRESSION: Similar appearance of the chest x-ray to the prior, with bilateral mixed interstitial and airspace disease and right greater than left pleural effusions. No evidence of pneumothorax on the current plain film. Surgical changes of median sternotomy and CABG, with cardiomegaly. Electronically Signed   By: Gilmer Mor D.O.   On: 05/10/2020 13:50   US RENAL  Result Date: May 10, 2020 CLINICAL DATA:  Acute kidney injury EXAM: RENAL / URINARY TRACT ULTRASOUND COMPLETE COMPARISON:  Same day chest radiograph. Renal ultrasound dated 12/12/2015. FINDINGS: Right Kidney: Renal measurements: 9.4 x 4.4 x 5.0 cm = volume: 103 mL . Echogenicity is increased. A 6 mm nonobstructive calculus is seen. No mass or hydronephrosis visualized. Left Kidney: Renal measurements: 10.8 x 5.3 x 6.6 cm = volume: 200 mL. Echogenicity is increased. There are multiple ureteral calculi seen in the mid ureter measuring up to 2 cm with resulting severe hydronephrosis. Multiple renal calculi are seen, measuring up to 1.1 cm. No mass is visualized. Bladder: Appears normal for degree of bladder distention. Other: A right pleural effusion is seen. IMPRESSION: Multiple obstructing left ureteral calculi resulting in severe hydronephrosis. Electronically Signed   By: Romona Curls M.D.   On: May 10, 2020 21:09   ECHOCARDIOGRAM COMPLETE  Result Date: 05/10/20    ECHOCARDIOGRAM REPORT   Patient Name:   Austin Roberts Date of Exam: 05/10/20 Medical Rec #:  [id number]       Height:       70.0 in Accession #:    [id number]      Weight:       180.1 lb Date of Birth:  Mar 03, 1929      BSA:          1.996 m Patient Age:    86 years        BP:           125/53 mmHg Patient Gender: M               HR:           70 bpm. Exam Location:  [place] [facility] Procedure: 2D Echo, Cardiac  Doppler and Color Doppler Indications:    CHF-Acute Systolic  History:        Patient has no prior history of Echocardiogram examinations. CAD                 and Previous Myocardial Infarction, Prior CABG, COPD,                 Arrythmias:Atrial Fibrillation; Risk Factors:Dyslipidemia, Sleep                 Apnea and Former  Smoker.  Sonographer:    Ross Ludwig RDCS (AE) Referring Phys: [id number] [place] General IMPRESSIONS  1. Left ventricular ejection fraction, by estimation, is 40 to 45%. The left ventricle has mildly decreased function. The left ventricle demonstrates global hypokinesis. There is moderate left ventricular hypertrophy. Left ventricular diastolic parameters are indeterminate.  2. Right ventricular systolic function is mildly reduced. The right ventricular size is mildly enlarged. There is moderately elevated pulmonary artery systolic pressure. The estimated right ventricular systolic pressure is 45.8 mmHg.  3. Left atrial size was moderately dilated.  4. Right atrial size was mildly dilated.  5. The mitral valve is degenerative. Mild to moderate mitral valve regurgitation.  6. Tricuspid valve regurgitation is mild to moderate.  7. The aortic valve is tricuspid, severely calcified. Aortic valve regurgitation is mild. Severe, low gradient aortic valve stenosis. Aortic valve mean gradient measures 25.0 mmHg. Aortic valve Vmax measures 3.45 m/s. Dimentionless index 0.18.  8. The inferior vena cava is normal in size with greater than 50% respiratory variability, suggesting right atrial pressure of 3 mmHg. FINDINGS  Left Ventricle: Left ventricular ejection fraction, by estimation, is 40 to 45%. The left ventricle has mildly decreased function. The left ventricle demonstrates global hypokinesis. The left ventricular internal cavity size was normal in size. There is  moderate left ventricular hypertrophy. Left ventricular diastolic parameters are indeterminate. Right Ventricle: The right ventricular size is  mildly enlarged. No increase in right ventricular wall thickness. Right ventricular systolic function is mildly reduced. There is moderately elevated pulmonary artery systolic pressure. The tricuspid regurgitant velocity is 3.27 m/s, and with an assumed right atrial pressure of 3 mmHg, the estimated right ventricular systolic pressure is 45.8 mmHg. Left Atrium: Left atrial size was moderately dilated. Right Atrium: Right atrial size was mildly dilated. Pericardium: There is no evidence of pericardial effusion. Mitral Valve: The mitral valve is degenerative in appearance. Moderate mitral annular calcification. Mild to moderate mitral valve regurgitation. Tricuspid Valve: The tricuspid valve is grossly normal. Tricuspid valve regurgitation is mild to moderate. Aortic Valve: The aortic valve is tricuspid. Aortic valve regurgitation is mild. Aortic regurgitation PHT measures 362 msec. Severe aortic stenosis is present. Mild to moderate aortic valve annular calcification. There is severe calcifcation of the aortic valve. Aortic valve mean gradient measures 25.0 mmHg. Aortic valve peak gradient measures 47.7 mmHg. Aortic valve area, by VTI measures 0.52 cm. Pulmonic Valve: The pulmonic valve was grossly normal. Pulmonic valve regurgitation is trivial. Aorta: The aortic root is normal in size and structure. Venous: The inferior vena cava is normal in size with greater than 50% respiratory variability, suggesting right atrial pressure of 3 mmHg. IAS/Shunts: No atrial level shunt detected by color flow Doppler. Additional Comments: There is pleural effusion in the left lateral region.  LEFT VENTRICLE PLAX 2D LVIDd:         4.20 cm LVIDs:         4.10 cm LV PW:         1.60 cm LV IVS:        1.60 cm LVOT diam:     1.90 cm LV SV:         39 LV SV Index:   20 LVOT Area:     2.84 cm  LV Volumes (MOD) LV vol d, MOD A2C: 107.0 ml LV vol d, MOD A4C: 72.4 ml LV vol s, MOD A2C: 56.4 ml LV vol s, MOD A4C: 62.9 ml LV SV MOD A2C:  50.6 ml LV SV MOD A4C:     72.4 ml LV SV MOD BP:      30.2 ml RIGHT VENTRICLE            IVC RV Basal diam:  4.00 cm    IVC diam: 1.70 cm RV Mid diam:    3.50 cm RV S prime:     5.43 cm/s TAPSE (M-mode): 1.1 cm LEFT ATRIUM             Index       RIGHT ATRIUM           Index LA diam:        4.80 cm 2.40 cm/m  RA Area:     21.90 cm LA Vol (A2C):   85.2 ml 42.68 ml/m RA Volume:   66.60 ml  33.36 ml/m LA Vol (A4C):   88.2 ml 44.18 ml/m LA Biplane Vol: 87.0 ml 43.58 ml/m  AORTIC VALVE AV Area (Vmax):    0.44 cm AV Area (Vmean):   0.47 cm AV Area (VTI):     0.52 cm AV Vmax:           345.40 cm/s AV Vmean:          225.200 cm/s AV VTI:            0.758 m AV Peak Grad:      47.7 mmHg AV Mean Grad:      25.0 mmHg LVOT Vmax:         53.16 cm/s LVOT Vmean:        37.320 cm/s LVOT VTI:          0.139 m LVOT/AV VTI ratio: 0.18 AI PHT:            362 msec  AORTA Ao Root diam: 3.60 cm Ao Asc diam:  3.40 cm TRICUSPID VALVE TR Peak grad:   42.8 mmHg TR Vmax:        327.00 cm/s  SHUNTS Systemic VTI:  0.14 m Systemic Diam: 1.90 cm Nona Dell MD Electronically signed by Nona Dell MD Signature Date/Time: 05/04/2020/4:59:47 PM    Final    Telemetry    05-07-2020 AF with rates in the 70's with frequent PVCs  - Personally Reviewed  ECG    No new tracings as of May 07, 2020 - Personally Reviewed  Cardiac Studies   Echocardiogram 05/06/2020  1. Left ventricular ejection fraction, by estimation, is 40 to 45%. The left ventricle has mildly decreased function. The left ventricle demonstrates global hypokinesis. There is moderate left ventricular hypertrophy. Left ventricular diastolic parameters are indeterminate. 2. Right ventricular systolic function is mildly reduced. The right ventricular size is mildly enlarged. There is moderately elevated pulmonary artery systolic pressure. The estimated right ventricular systolic pressure is 45.8 mmHg. 3. Left atrial size was moderately dilated. 4. Right atrial size was mildly  dilated. 5. The mitral valve is degenerative. Mild to moderate mitral valve regurgitation. 6. Tricuspid valve regurgitation is mild to moderate. 7. The aortic valve is tricuspid, severely calcified. Aortic valve regurgitation is mild. Severe, low gradient aortic valve stenosis. Aortic valve mean gradient measures 25.0 mmHg. Aortic valve Vmax measures 3.45 m/s. Dimentionless index 0.18. 8. The inferior vena cava is normal in size with greater than 50% respiratory variability, suggesting right atrial pressure of 3 mmHg.   Patient Profile     86 y.o. male with a history of CAD status post CABG approximately 10 years ago, permanent atrial fibrillation, hypertension, OSA, COPD, dementia with  failure to thrive, and possibly aortic stenosiswho is being seen today for the evaluation of possible aortic stenosis and congestive heart failureat the request of DrZhang.  Assessment & Plan    1. CAD: -Remote hx of CABG ([place] 10 years ago) -Trivial hsT  -Echo yesterday with reduce LV function at 40 to 45%, global hypokinesis and   severe AS  2. Acute on chronic systolic CHF: -Receieved 20 mg lasix IV  On presentation without response therefore repeat dose of 80 IV given yesterday at Cardiology evaluation  -Does not appear to be overtly fluid volume overloaded on exam today  -Would hold Lasix today given rise in creatine  -Weight, 136lb  -I&O, net negative >>>poor OP  -Creatinine 1.86 today>>up form 1.62 yesterday   3. Aortic stenosis: -Per echocardiogram with severe>>mean gradient is not severely elevated by LVOT/AV VTI is 0.18 consistent with severe AS per Dr. Tenny Craw note felt note to be a I candidate for interventions  4. Chronic atrial fibrillation: -Rates>>60-70's with frequent PVCs  -Not on anticoagulation secondary to high risk falls -Not currently on AV nodal blocking agents  5. CKD Stage II: -Creatinine, 1.86>>>up form 1.62 yesterday   6. Palliative care: -Consulted 04/25/20  with plans for [facility] hospice/palliative care  -Currently on morphine gtt   Signed, Georgie Chard NP-C [facility] Pager: [phone] 05/01/2020, 8:57 AM     For questions or updates, please contact   Please consult [email or web address] for contact info under Cardiology/STEMI.   Attending Note:   The patient was seen and examined.  Agree with assessment and plan as noted above.  Changes made to the above note as needed.  Patient seen and independently examined with  Georgie Chard, NP .   We discussed all aspects of the encounter. I agree with the assessment and plan as stated above.  1.  Aortic stenosis:   Severe AS .  He is very frail and is not a candidate for interventions.  He admits to eating lots of salt.  Advised him to avoid salty foods.   2.  Cachexia:  He is very thin.  Does not eat much .  3.  CKD:   Creatinine is 1.86 after IV lasix yesterday   4.  Chronic atrial fib:  Not on anticoagulation due to risk of falls.    I agree with hospice / palliative care approach     [facility] will sign off.   Medication Recommendations:  Cont meds.   Add lasix as needed / as tolerted.  Other recommendations (labs, testing, etc):  Hospice / palliative care  Follow up as an outpatient:   With his primary md, hospice.          Vesta Mixer, Montez Hageman., MD, [facility] 05/18/2020, 10:19 AM [address]  [address] Office [phone] Pager [phone]

## 2020-05-22 NOTE — Evaluation (Signed)
Occupational Therapy Evaluation Patient Details Name: Austin Roberts MRN: [id number] DOB: Feb 21, 1929 Today's Date: 05/21/20    History of Present Illness 86 y.o. male with medical history significant of CAD status post CABG 10 years ago, chronic A. fib aortic stenosis, hypertension, sleep apnea, question of COPD, early dementia with failure to thrivepresented with fall. In [facility] ED found to have bilateral pleural effusion and pulmonary congestion. Admitted to [facility] 05/13/2020 for acute on chronic CHF decompensation plus aortic stenosis decompensation.    Clinical Impression   Pt PTA: Pt lives with family and was able to perform ADL tasks with assist. Pt currently requires maxA to totalA for ADL at this time due to decreased cognition, decreased mobility, decreased strength and decreased ability to care for self. No family present.  Pt modA for sit to stand with RW and maximal cueing for task. Pt would benefit from continued OT skilled services. OT following acutely. O2 >98% on 2L and HR 79 BPM.       Follow Up Recommendations  SNF;Other (comment) (home with palliative/hospice with 24/7 care)    Equipment Recommendations  Other (comment) (defer to next facility)    Recommendations for Other Services       Precautions / Restrictions Precautions Precautions: Fall Precaution Comments: fall prior to hospitalization  Restrictions Weight Bearing Restrictions: No      Mobility Bed Mobility               General bed mobility comments: in recliner pre and post sesion  Transfers Overall transfer level: Needs assistance Equipment used: Rolling walker (2 wheeled);1 person hand held assist Transfers: Sit to/from Stand Sit to Stand: Mod assist         General transfer comment: Pt able to tolerate standing and taking 2 steps forward, but pt very difficult to initiate movement.    Balance Overall balance assessment: Needs assistance Sitting-balance support: Feet  supported;No upper extremity supported Sitting balance-Leahy Scale: Poor     Standing balance support: Bilateral upper extremity supported Standing balance-Leahy Scale: Poor                             ADL either performed or assessed with clinical judgement   ADL Overall ADL's : Needs assistance/impaired Eating/Feeding: Total assistance   Grooming: Maximal assistance   Upper Body Bathing: Maximal assistance   Lower Body Bathing: Total assistance   Upper Body Dressing : Maximal assistance   Lower Body Dressing: Total assistance   Toilet Transfer: Minimal assistance;RW   Toileting- Clothing Manipulation and Hygiene: Total assistance       Functional mobility during ADLs: Maximal assistance;+2 for physical assistance;Rolling walker;Cueing for safety;Cueing for sequencing General ADL Comments: Pt requires maxA to totalA for ADL at this time due to decreased cognition, decreased mobility, decreased strength and decreased ability to care for self.     Vision Baseline Vision/History: Wears glasses Wears Glasses: At all times Patient Visual Report: No change from baseline Vision Assessment?: No apparent visual deficits     Perception     Praxis      Pertinent Vitals/Pain Pain Assessment: Faces Faces Pain Scale: Hurts a little bit Pain Location: generalized in suping Pain Descriptors / Indicators: Grimacing Pain Intervention(s): Monitored during session;Repositioned     Hand Dominance Right   Extremity/Trunk Assessment Upper Extremity Assessment Upper Extremity Assessment: Generalized weakness   Lower Extremity Assessment Lower Extremity Assessment: Generalized weakness   Cervical / Trunk Assessment Cervical /  Trunk Assessment: Kyphotic   Communication Communication Communication: HOH   Cognition Arousal/Alertness: Awake/alert Behavior During Therapy: WFL for tasks assessed/performed Overall Cognitive Status: History of cognitive impairments -  at baseline                                 General Comments: PLOF and hx provided by daughter, pt requires increased time and repeated cues to conclude task   General Comments  No family present; O2 >98% on 2L and HR 79 BPM.    Exercises     Shoulder Instructions      Home Living Family/patient expects to be discharged to:: Private residence Living Arrangements: Children Available Help at Discharge: Family Type of Home: House Home Access: Stairs to enter Secretary/administrator of Steps: 3   Home Layout: One level     Bathroom Shower/Tub: Chief Strategy Officer: Standard     Home Equipment: Environmental consultant - 2 wheels;Grab bars - tub/shower          Prior Functioning/Environment Level of Independence: Needs assistance  Gait / Transfers Assistance Needed: household ambulation with RW ADL's / Homemaking Assistance Needed: son in law assists with transfer into shower and supervision, able to dress but has placed underware over sweatpants and forgotten pants all together            OT Problem List: Decreased strength;Decreased activity tolerance;Impaired balance (sitting and/or standing);Pain;Increased edema;Decreased safety awareness;Cardiopulmonary status limiting activity;Decreased knowledge of use of DME or AE      OT Treatment/Interventions: Self-care/ADL training;Therapeutic exercise;Neuromuscular education;DME and/or AE instruction;Therapeutic activities;Patient/family education;Balance training;Cognitive remediation/compensation    OT Goals(Current goals can be found in the care plan section) Acute Rehab OT Goals Patient Stated Goal: none stated OT Goal Formulation: Patient unable to participate in goal setting Time For Goal Achievement: 05/10/20 Potential to Achieve Goals: Fair ADL Goals Pt Will Perform Grooming: with min assist;sitting Pt Will Transfer to Toilet: with min assist;ambulating;bedside commode Additional ADL Goal #1: pt will  follow 1 step commands consistently with <2 verbal cues.  OT Frequency: Min 2X/week   Barriers to D/C:            Co-evaluation              AM-PAC OT "6 Clicks" Daily Activity     Outcome Measure Help from another person eating meals?: A Lot Help from another person taking care of personal grooming?: A Lot Help from another person toileting, which includes using toliet, bedpan, or urinal?: A Lot Help from another person bathing (including washing, rinsing, drying)?: A Lot Help from another person to put on and taking off regular upper body clothing?: A Lot Help from another person to put on and taking off regular lower body clothing?: A Lot 6 Click Score: 12   End of Session Equipment Utilized During Treatment: Gait belt;Rolling walker;Oxygen Nurse Communication: Mobility status  Activity Tolerance: Patient tolerated treatment well Patient left: in chair;with call bell/phone within reach;with chair alarm set  OT Visit Diagnosis: Unsteadiness on feet (R26.81);Muscle weakness (generalized) (M62.81);Pain Pain - part of body:  (bottom)                Time: 4098-1191 OT Time Calculation (min): 16 min Charges:  OT General Charges $OT Visit: 1 Visit OT Evaluation $OT Eval Moderate Complexity: 1 Mod  Flora Lipps, OTR/L Acute Rehabilitation Services Pager: [phone] Office: [phone]   Anthem Frazer C 05/06/2020, 12:26 PM

## 2020-05-22 NOTE — Death Summary Note (Signed)
Death Summary  Austin Roberts CHE:[id number] DOB: [birth year] DOA: 2020/05/21  PCP: Aida Puffer, MD  Admit date: May 21, 2020 Date of Death: 05/23/2020 Time of Death: 79   History of present illness:  86 year old male with history of CAD status post CABG 10 years ago, chronic A. fib, aortic stenosis, hypertension, sleep apnea, question of COPD, dementia presented with fall and worsening confusion.  He was taken to [facility] ED where work-up showed pulmonary congestion and bilateral pleural effusions with troponin elevation up to 0.485.  CT head was negative.  He was transferred to [facility] for further cardiology evaluation. Echo revealed severe AS. Cardiology recommended palliative care. Patient's condition didn't improve. Family was deciding to pursue hospice care. Patient expired on 23-May-2020 at 1837. Family was notified.  Final Diagnoses:  Acute on chronic systolic CHF Mildly positive troponin Severe aortic stenosis Chronic Afib Hypertension COPD AKI on CKD IIIa Anemia of chronic disease Severe protein calorie malnutrition/Failure to thrive Delirium  The results of significant diagnostics from this hospitalization (including imaging, microbiology, ancillary and laboratory) are listed below for reference.    Significant Diagnostic Studies: DG Chest 1 View  Result Date: 04/25/2020 CLINICAL DATA:  Patient admitted 2020-05-21 after a fall. History of chronic heart failure. EXAM: CHEST  1 VIEW COMPARISON:  Single-view of the chest 05-21-20. PA and lateral chest and CT chest 02/02/2017. FINDINGS: Extensive airspace disease throughout the right chest and a right pleural effusion have worsened since the most recent exam. Smaller left pleural effusion and left basilar airspace disease appear unchanged. There is cardiomegaly. The patient is status post CABG. No pneumothorax. IMPRESSION: Right pleural effusion and extensive airspace disease throughout the right chest likely due  to pneumonia have worsened since the most recent study. No change in a small left pleural effusion and basilar airspace disease. Cardiomegaly. Electronically Signed   By: Drusilla Kanner M.D.   On: 04/25/2020 12:22   DG Chest 2 View  Result Date: 05/21/2020 CLINICAL DATA:  86 year old male with a history of CHF EXAM: CHEST - 2 VIEW COMPARISON:  04/23/2020 chest CT 02/02/2017, plain from 02/02/2017 FINDINGS: Cardiomediastinal silhouette unchanged in size and contour with cardiomegaly. Heart borders partially obscured by overlying lung/pleural disease. Mixed interstitial and airspace opacities of the bilateral lungs, unchanged from the comparison. Meniscus at the right lung base. Blunting of the left costophrenic angle. No evidence of pneumothorax. Surgical changes of median sternotomy and CABG. IMPRESSION: Similar appearance of the chest x-ray to the prior, with bilateral mixed interstitial and airspace disease and right greater than left pleural effusions. No evidence of pneumothorax on the current plain film. Surgical changes of median sternotomy and CABG, with cardiomegaly. Electronically Signed   By: Gilmer Mor D.O.   On: 21-May-2020 13:50   US RENAL  Result Date: 21-May-2020 CLINICAL DATA:  Acute kidney injury EXAM: RENAL / URINARY TRACT ULTRASOUND COMPLETE COMPARISON:  Same day chest radiograph. Renal ultrasound dated 12/12/2015. FINDINGS: Right Kidney: Renal measurements: 9.4 x 4.4 x 5.0 cm = volume: 103 mL . Echogenicity is increased. A 6 mm nonobstructive calculus is seen. No mass or hydronephrosis visualized. Left Kidney: Renal measurements: 10.8 x 5.3 x 6.6 cm = volume: 200 mL. Echogenicity is increased. There are multiple ureteral calculi seen in the mid ureter measuring up to 2 cm with resulting severe hydronephrosis. Multiple renal calculi are seen, measuring up to 1.1 cm. No mass is visualized. Bladder: Appears normal for degree of bladder distention. Other: A right pleural effusion is seen.  IMPRESSION:  Multiple obstructing left ureteral calculi resulting in severe hydronephrosis. Electronically Signed   By: Romona Curls M.D.   On: 05/19/2020 21:09   ECHOCARDIOGRAM COMPLETE  Result Date: 05/05/2020    ECHOCARDIOGRAM REPORT   Patient Name:   Austin Roberts Date of Exam: 04/28/2020 Medical Rec #:  [id number]       Height:       70.0 in Accession #:    [id number]      Weight:       180.1 lb Date of Birth:  1928/11/12      BSA:          1.996 m Patient Age:    89 years        BP:           125/53 mmHg Patient Gender: M               HR:           70 bpm. Exam Location:  [place] Procedure: 2D Echo, Cardiac Doppler and Color Doppler Indications:    CHF-Acute Systolic  History:        Patient has no prior history of Echocardiogram examinations. CAD                 and Previous Myocardial Infarction, Prior CABG, COPD,                 Arrythmias:Atrial Fibrillation; Risk Factors:Dyslipidemia, Sleep                 Apnea and Former Smoker.  Sonographer:    Ross Ludwig RDCS (AE) Referring Phys: [id number] [place] General IMPRESSIONS  1. Left ventricular ejection fraction, by estimation, is 40 to 45%. The left ventricle has mildly decreased function. The left ventricle demonstrates global hypokinesis. There is moderate left ventricular hypertrophy. Left ventricular diastolic parameters are indeterminate.  2. Right ventricular systolic function is mildly reduced. The right ventricular size is mildly enlarged. There is moderately elevated pulmonary artery systolic pressure. The estimated right ventricular systolic pressure is 45.8 mmHg.  3. Left atrial size was moderately dilated.  4. Right atrial size was mildly dilated.  5. The mitral valve is degenerative. Mild to moderate mitral valve regurgitation.  6. Tricuspid valve regurgitation is mild to moderate.  7. The aortic valve is tricuspid, severely calcified. Aortic valve regurgitation is mild. Severe, low gradient aortic valve stenosis. Aortic valve mean gradient  measures 25.0 mmHg. Aortic valve Vmax measures 3.45 m/s. Dimentionless index 0.18.  8. The inferior vena cava is normal in size with greater than 50% respiratory variability, suggesting right atrial pressure of 3 mmHg. FINDINGS  Left Ventricle: Left ventricular ejection fraction, by estimation, is 40 to 45%. The left ventricle has mildly decreased function. The left ventricle demonstrates global hypokinesis. The left ventricular internal cavity size was normal in size. There is  moderate left ventricular hypertrophy. Left ventricular diastolic parameters are indeterminate. Right Ventricle: The right ventricular size is mildly enlarged. No increase in right ventricular wall thickness. Right ventricular systolic function is mildly reduced. There is moderately elevated pulmonary artery systolic pressure. The tricuspid regurgitant velocity is 3.27 m/s, and with an assumed right atrial pressure of 3 mmHg, the estimated right ventricular systolic pressure is 45.8 mmHg. Left Atrium: Left atrial size was moderately dilated. Right Atrium: Right atrial size was mildly dilated. Pericardium: There is no evidence of pericardial effusion. Mitral Valve: The mitral valve is degenerative in appearance. Moderate mitral annular calcification. Mild to moderate mitral  valve regurgitation. Tricuspid Valve: The tricuspid valve is grossly normal. Tricuspid valve regurgitation is mild to moderate. Aortic Valve: The aortic valve is tricuspid. Aortic valve regurgitation is mild. Aortic regurgitation PHT measures 362 msec. Severe aortic stenosis is present. Mild to moderate aortic valve annular calcification. There is severe calcifcation of the aortic valve. Aortic valve mean gradient measures 25.0 mmHg. Aortic valve peak gradient measures 47.7 mmHg. Aortic valve area, by VTI measures 0.52 cm. Pulmonic Valve: The pulmonic valve was grossly normal. Pulmonic valve regurgitation is trivial. Aorta: The aortic root is normal in size and structure.  Venous: The inferior vena cava is normal in size with greater than 50% respiratory variability, suggesting right atrial pressure of 3 mmHg. IAS/Shunts: No atrial level shunt detected by color flow Doppler. Additional Comments: There is pleural effusion in the left lateral region.  LEFT VENTRICLE PLAX 2D LVIDd:         4.20 cm LVIDs:         4.10 cm LV PW:         1.60 cm LV IVS:        1.60 cm LVOT diam:     1.90 cm LV SV:         39 LV SV Index:   20 LVOT Area:     2.84 cm  LV Volumes (MOD) LV vol d, MOD A2C: 107.0 ml LV vol d, MOD A4C: 72.4 ml LV vol s, MOD A2C: 56.4 ml LV vol s, MOD A4C: 62.9 ml LV SV MOD A2C:     50.6 ml LV SV MOD A4C:     72.4 ml LV SV MOD BP:      30.2 ml RIGHT VENTRICLE            IVC RV Basal diam:  4.00 cm    IVC diam: 1.70 cm RV Mid diam:    3.50 cm RV S prime:     5.43 cm/s TAPSE (M-mode): 1.1 cm LEFT ATRIUM             Index       RIGHT ATRIUM           Index LA diam:        4.80 cm 2.40 cm/m  RA Area:     21.90 cm LA Vol (A2C):   85.2 ml 42.68 ml/m RA Volume:   66.60 ml  33.36 ml/m LA Vol (A4C):   88.2 ml 44.18 ml/m LA Biplane Vol: 87.0 ml 43.58 ml/m  AORTIC VALVE AV Area (Vmax):    0.44 cm AV Area (Vmean):   0.47 cm AV Area (VTI):     0.52 cm AV Vmax:           345.40 cm/s AV Vmean:          225.200 cm/s AV VTI:            0.758 m AV Peak Grad:      47.7 mmHg AV Mean Grad:      25.0 mmHg LVOT Vmax:         53.16 cm/s LVOT Vmean:        37.320 cm/s LVOT VTI:          0.139 m LVOT/AV VTI ratio: 0.18 AI PHT:            362 msec  AORTA Ao Root diam: 3.60 cm Ao Asc diam:  3.40 cm TRICUSPID VALVE TR Peak grad:   42.8 mmHg TR Vmax:  327.00 cm/s  SHUNTS Systemic VTI:  0.14 m Systemic Diam: 1.90 cm Nona DellSamuel Mcdowell MD Electronically signed by Nona DellSamuel Mcdowell MD Signature Date/Time: 04/25/2020/4:59:47 PM    Final     Microbiology: No results found for this or any previous visit (from the past 240 hour(s)).   Labs: Basic Metabolic Panel: Recent Labs  Lab 11-08-2019 1355  11-08-2019 1355 04/25/20 0410 04/27/2020 0349  NA 142  --  142 139  K 3.8   < > 3.6 3.7  CL 102  --  101 95*  CO2 27  --  32 31  GLUCOSE 160*  --  123* 256*  BUN 26*  --  25* 37*  CREATININE 1.64*  --  1.62* 1.86*  CALCIUM 9.6  --  9.3 9.5  MG  --   --   --  1.9   < > = values in this interval not displayed.   Liver Function Tests: No results for input(s): AST, ALT, ALKPHOS, BILITOT, PROT, ALBUMIN in the last 168 hours. No results for input(s): LIPASE, AMYLASE in the last 168 hours. No results for input(s): AMMONIA in the last 168 hours. CBC: Recent Labs  Lab 11-08-2019 1355  WBC 9.2  NEUTROABS 6.2  HGB 11.5*  HCT 36.2*  MCV 98.9  PLT 157   Cardiac Enzymes: No results for input(s): CKTOTAL, CKMB, CKMBINDEX, TROPONINI in the last 168 hours. D-Dimer No results for input(s): DDIMER in the last 72 hours. BNP: Invalid input(s): POCBNP CBG: No results for input(s): GLUCAP in the last 168 hours. Anemia work up No results for input(s): VITAMINB12, FOLATE, FERRITIN, TIBC, IRON, RETICCTPCT in the last 72 hours. Urinalysis    Component Value Date/Time   COLORURINE YELLOW 05/26/2015 1448   APPEARANCEUR CLOUDY (A) 05/26/2015 1448   LABSPEC 1.011 05/26/2015 1448   PHURINE 5.5 05/26/2015 1448   GLUCOSEU NEGATIVE 05/26/2015 1448   HGBUR LARGE (A) 05/26/2015 1448   BILIRUBINUR NEGATIVE 05/26/2015 1448   KETONESUR NEGATIVE 05/26/2015 1448   PROTEINUR 30 (A) 05/26/2015 1448   UROBILINOGEN 0.2 05/26/2015 1448   NITRITE NEGATIVE 05/26/2015 1448   LEUKOCYTESUR MODERATE (A) 05/26/2015 1448   Sepsis Labs Invalid input(s): PROCALCITONIN,  WBC,  LACTICIDVEN     SIGNED:  Glade LloydKshitiz Darwyn Ponzo, MD  [facility] 04/24/2020, 7:29 PM

## 2020-05-22 NOTE — Progress Notes (Signed)
Daily Progress Note   Patient Name: Austin Roberts       Date: 05/18/2020 DOB: 12-02-1928  Age: 86 y.o. MRN#: [id number] Attending Physician: Glade Lloyd, MD Primary Care Physician: Aida Puffer, MD Admit Date: May 14, 2020   HPI/Patient Profile: 86 y.o. male  with past medical history of coronary artery disease status past CABG 10 years ago, chronic atrial fibrillation, aortic stenosis, hypertension, sleep apnea, COPD admitted on 05/14/2020 with acute on chronic CHF decompensation, aortic stenosis decompensation, AKI versus CKD, and severe protein calorie malnutrition.  Patient initially presented to [facility] ED after a fall, work-up revealed pulmonary congestion, bilateral pleural effusion, and elevated troponin. CT head was negative. Patient transferred to [facility] for cardiology evaluation. Creatinine today (7/6) is 1.86, up from 1.62 7/5  Subjective: I spoke with daughter [place] by phone, and let her know he had issues with agitation overnight. Also let her know nursing staff concern that he was showing signs of aspiration with eating and drinking. I recommended to [place] that we proceed with transitioning to comfort care, and she agrees that this is the most appropriate decision at this point.   Discussed what comfort care entails in the hospital--keeping him clean and dry, no labs, no artificial hydration or feeding, no antibiotics, minimizing of medications, comfort feeds, medication for pain, dyspnea, and agitation as needed.    Length of Stay: 2  Current Medications: Scheduled Meds:  . budesonide  0.5 mg Nebulization BID  . feeding supplement (ENSURE ENLIVE)  237 mL Oral TID BM  . LORazepam  1 mg Oral BID  . morphine CONCENTRATE  5 mg Sublingual TID  . QUEtiapine  12.5 mg  Oral QHS    Continuous Infusions:   PRN Meds: acetaminophen **OR** acetaminophen, antiseptic oral rinse, glycopyrrolate, LORazepam **OR** LORazepam, ondansetron **OR** ondansetron (ZOFRAN) IV, polyvinyl alcohol  Physical Exam          Vital Signs: BP (!) 118/44   Pulse 78   Temp 98 F (36.7 C)   Resp (!) 21   Ht 5\' 10"  (1.778 m) Comment: previous visit  Wt 61.7 kg   SpO2 92%   BMI 19.51 kg/m  SpO2: SpO2: 92 % O2 Device: O2 Device: Nasal Cannula O2 Flow Rate: O2 Flow Rate (L/min): 4 L/min  Intake/output summary:   Intake/Output Summary (Last 24 hours)  at 04/27/2020 1256 Last data filed at 05/19/2020 7062 Gross per 24 hour  Intake 240 ml  Output 750 ml  Net -510 ml   LBM: Last BM Date: 05/14/2020 Baseline Weight: Weight: 61.7 kg Most recent weight: Weight: 61.7 kg       Palliative Assessment/Data: 20%      Patient Active Problem List   Diagnosis Date Noted  . Protein-calorie malnutrition, severe 04/30/2020  . Aortic stenosis, severe   . Palliative care by specialist   . Goals of care, counseling/discussion   . DNR (do not resuscitate)   . Acute CHF (congestive heart failure) (HCC) 2020-05-19  . CHF (congestive heart failure) (HCC) May 19, 2020  . PNA (pneumonia) 02/02/2017  . Hypokalemia 02/01/2017  . Hypertension 02/01/2017  . Coronary artery disease 02/01/2017  . Asthma 02/01/2017  . Hyperlipidemia 02/01/2017  . Sleep apnea 02/01/2017  . Arthritis 02/01/2017  . Chronic atrial fibrillation (HCC) 02/01/2017    Palliative Care Assessment & Plan   Assessment:  86 yo patient with multiple cardiac issues--acute on chronic CHF, severe aortic stenosis not a candidate for interventions, and chronic A-fib not on coagulation due to fall risk. Also with chronic kidney disease and worsening creatinine. All of these issues are in the setting of dementia with significant functional and nutritional decline over the past few months. Patient currently with minimal po intake,  and showing signs of aspiration.  Recommendations/Plan: - transition to full comfort care - continue scheduled morphine solution 5 mg TID for pain  - start scheduled ativan 1 mg BID for agitation  Symptom Management:   Per end of life order set  Lorazepam (ATIVAN) prn for anxiety  Glycopyrrolate (ROBINUL) for excessive secretions  Ondansetron (ZOFRAN) prn for nausea  Goals of Care and Additional Recommendations:  Limitations on Scope of Treatment: Full Comfort Care  Code Status: DNR/DNI  Prognosis:   < 2 weeks  Discharge Planning:  Hospice facility, pending acceptance  Care plan was discussed with Dr. Hanley Ben, bedside RN, CSW  Thank you for allowing the Palliative Medicine Team to assist in the care of this patient.   Total Time 35 minutes Prolonged Time Billed  no       Greater than 50%  of this time was spent counseling and coordinating care related to the above assessment and plan.  Merry Proud, NP  Please contact Palliative Medicine Team phone at [phone] for questions and concerns.

## 2020-05-22 NOTE — Progress Notes (Addendum)
Patient is very disoriented (unable to reorient) and keeps attempting to get out of bed/chair, toileting needs addressed.Pt reminded multiple times that he has a condom cath in place and adament in ambulating to the bathroom "down the hall". Paged Opyd, MD requesting medication.   Bari Edward, RN

## 2020-05-22 NOTE — Progress Notes (Signed)
Patient ID: Austin Roberts, male   DOB: [birth year], 86 y.o.   MRN: [id number]  PROGRESS NOTE    Austin Roberts  WUJ:[id number] DOB: 1929/10/07 DOA: 05/01/2020 PCP: Aida Puffer, MD   Brief Narrative:  86 year old male with history of CAD status post CABG 10 years ago, chronic A. fib, aortic stenosis, hypertension, sleep apnea, question of COPD, dementia presented with fall and worsening confusion.  He was taken to [facility] ED where work-up showed pulmonary congestion and bilateral pleural effusions with troponin elevation up to 0.485.  CT head was negative.  He was transferred to [facility] for further cardiology evaluation.  Assessment & Plan:   Acute on chronic systolic CHF Mildly positive troponin -Cardiology following.  Echo shows EF of 40 to 45% with severe AS -Troponin only mildly positive and did not trend up: Probably from CHF exacerbation -Diuretic dosing as per cardiology.  Strict input output.  Daily weights.  Fluid restriction.  Severe aortic stenosis -Follow cardiology recommendations.  Doubt that patient would be a candidate for any surgical intervention  Chronic A. Fib -Rate controlled.  Continue aspirin.  Not on chronic anticoagulant as an outpatient given history of falls.  Hypertension -Monitor.  Blood pressure on the lower side.  Diuretic plan as per cardiology.  COPD -Stable.  Use nebs as needed  AKI on chronic kidney disease stage IIIa -Patient probably has chronic kidney disease stage IIIa.  Unknown recent baseline.  Presented with creatinine of 1.5 at [facility] ED.  1.64 yesterday on presentation to our hospital.  Creatinine 1.86 today.  Monitor.  Anemia of chronic disease -probably from chronic kidney disease.  Hemoglobin stable.  Monitor  Severe protein calorie malnutrition/failure to thrive Generalized deconditioning Delirium -Overall prognosis is guarded to poor.  Palliative care consultation appreciated.  Consider residential  hospice/comfort measures -Delirium precautions.  Might need to put him on scheduled Seroquel as well -PT recommended SNF placement. -Marinol has been started on admission   DVT prophylaxis: Heparin subcutaneous  code Status: DNR Family Communication: Called daughter/[place] on phone on 04/25/2020 but she did not pick up Disposition Plan: Status is: Inpatient  Remains inpatient appropriate because:Inpatient level of care appropriate due to severity of illness   Dispo: The patient is from: Home              Anticipated d/c is to: SNF versus residential hospice              Anticipated d/c date is: 1 day              Patient currently is not medically stable to d/c.   Consultants: Cardiology.  Will consult palliative care  Procedures: Echo 1. Left ventricular ejection fraction, by estimation, is 40 to 45%. The  left ventricle has mildly decreased function. The left ventricle  demonstrates global hypokinesis. There is moderate left ventricular  hypertrophy. Left ventricular diastolic  parameters are indeterminate.  2. Right ventricular systolic function is mildly reduced. The right  ventricular size is mildly enlarged. There is moderately elevated  pulmonary artery systolic pressure. The estimated right ventricular  systolic pressure is 45.8 mmHg.  3. Left atrial size was moderately dilated.  4. Right atrial size was mildly dilated.  5. The mitral valve is degenerative. Mild to moderate mitral valve  regurgitation.  6. Tricuspid valve regurgitation is mild to moderate.  7. The aortic valve is tricuspid, severely calcified. Aortic valve  regurgitation is mild. Severe, low gradient aortic valve stenosis. Aortic  valve mean  gradient measures 25.0 mmHg. Aortic valve Vmax measures 3.45  m/s. Dimentionless index 0.18.  8. The inferior vena cava is normal in size with greater than 50%  respiratory variability, suggesting right atrial pressure of 3 mmHg.   Antimicrobials:    None  Subjective: Patient seen and examined at present.  Very poor historian, wakes up slightly, hardly answers any questions.  Nursing staff reports that he was very disoriented overnight and kept attempting to get out of bed.  No fever, vomiting reported. Objective: Vitals:   04/25/20 2020 2020/04/30 0006 30-Apr-2020 0653 April 30, 2020 0744  BP:  (!) 134/55 132/86   Pulse:   80   Resp:      Temp:  98 F (36.7 C) 98 F (36.7 C)   TempSrc:  Oral Oral   SpO2: 98%  90% 99%  Weight:      Height:        Intake/Output Summary (Last 24 hours) at 04/30/20 0801 Last data filed at 04/30/2020 0654 Gross per 24 hour  Intake --  Output 750 ml  Net -750 ml   Filed Weights   04/25/20 0456  Weight: 61.7 kg    Examination:  General exam: No acute distress.  Extremely ill looking elderly male.  Looks cachectic.   Respiratory system: Bilateral decreased breath sounds at bases with bibasilar crackles Cardiovascular system: Rate controlled, S1-S2 heard Gastrointestinal system: Abdomen is nondistended, soft and nontender.  Bowel sounds are heard  extremities: Trace bilateral lower extremity edema present.  No clubbing Central nervous system: Very poor historian.  Confused.  Hardly answers any questions.  No focal neurological deficits. Moving extremities Skin: No obvious ecchymosis/ulcers Psychiatry: Cannot assess because of patient's mental status   Data Reviewed: I have personally reviewed following labs and imaging studies  CBC: Recent Labs  Lab 04/23/2020 1355  WBC 9.2  NEUTROABS 6.2  HGB 11.5*  HCT 36.2*  MCV 98.9  PLT 157   Basic Metabolic Panel: Recent Labs  Lab 05/02/2020 1355 04/25/20 0410 2020/04/30 0349  NA 142 142 139  K 3.8 3.6 3.7  CL 102 101 95*  CO2 27 32 31  GLUCOSE 160* 123* 256*  BUN 26* 25* 37*  CREATININE 1.64* 1.62* 1.86*  CALCIUM 9.6 9.3 9.5  MG  --   --  1.9   GFR: Estimated Creatinine Clearance: 24 mL/min (A) (by C-G formula based on SCr of 1.86 mg/dL  (H)). Liver Function Tests: No results for input(s): AST, ALT, ALKPHOS, BILITOT, PROT, ALBUMIN in the last 168 hours. No results for input(s): LIPASE, AMYLASE in the last 168 hours. No results for input(s): AMMONIA in the last 168 hours. Coagulation Profile: No results for input(s): INR, PROTIME in the last 168 hours. Cardiac Enzymes: No results for input(s): CKTOTAL, CKMB, CKMBINDEX, TROPONINI in the last 168 hours. BNP (last 3 results) No results for input(s): PROBNP in the last 8760 hours. HbA1C: No results for input(s): HGBA1C in the last 72 hours. CBG: No results for input(s): GLUCAP in the last 168 hours. Lipid Profile: No results for input(s): CHOL, HDL, LDLCALC, TRIG, CHOLHDL, LDLDIRECT in the last 72 hours. Thyroid Function Tests: Recent Labs    04/28/2020 1635  TSH 1.180   Anemia Panel: No results for input(s): VITAMINB12, FOLATE, FERRITIN, TIBC, IRON, RETICCTPCT in the last 72 hours. Sepsis Labs: Recent Labs  Lab 05/06/2020 1355  PROCALCITON 0.74    No results found for this or any previous visit (from the past 240 hour(s)).  Radiology Studies: DG Chest 1 View  Result Date: 04/25/2020 CLINICAL DATA:  Patient admitted 05/21/2020 after a fall. History of chronic heart failure. EXAM: CHEST  1 VIEW COMPARISON:  Single-view of the chest 05/04/2020. PA and lateral chest and CT chest 02/02/2017. FINDINGS: Extensive airspace disease throughout the right chest and a right pleural effusion have worsened since the most recent exam. Smaller left pleural effusion and left basilar airspace disease appear unchanged. There is cardiomegaly. The patient is status post CABG. No pneumothorax. IMPRESSION: Right pleural effusion and extensive airspace disease throughout the right chest likely due to pneumonia have worsened since the most recent study. No change in a small left pleural effusion and basilar airspace disease. Cardiomegaly. Electronically Signed   By: Drusilla Kannerhomas  Dalessio M.D.    On: 04/25/2020 12:22   DG Chest 2 View  Result Date: 04/21/2020 CLINICAL DATA:  86 year old male with a history of CHF EXAM: CHEST - 2 VIEW COMPARISON:  04/23/2020 chest CT 02/02/2017, plain from 02/02/2017 FINDINGS: Cardiomediastinal silhouette unchanged in size and contour with cardiomegaly. Heart borders partially obscured by overlying lung/pleural disease. Mixed interstitial and airspace opacities of the bilateral lungs, unchanged from the comparison. Meniscus at the right lung base. Blunting of the left costophrenic angle. No evidence of pneumothorax. Surgical changes of median sternotomy and CABG. IMPRESSION: Similar appearance of the chest x-ray to the prior, with bilateral mixed interstitial and airspace disease and right greater than left pleural effusions. No evidence of pneumothorax on the current plain film. Surgical changes of median sternotomy and CABG, with cardiomegaly. Electronically Signed   By: Gilmer MorJaime  Wagner D.O.   On: 05/08/2020 13:50   US RENAL  Result Date: 04/28/2020 CLINICAL DATA:  Acute kidney injury EXAM: RENAL / URINARY TRACT ULTRASOUND COMPLETE COMPARISON:  Same day chest radiograph. Renal ultrasound dated 12/12/2015. FINDINGS: Right Kidney: Renal measurements: 9.4 x 4.4 x 5.0 cm = volume: 103 mL . Echogenicity is increased. A 6 mm nonobstructive calculus is seen. No mass or hydronephrosis visualized. Left Kidney: Renal measurements: 10.8 x 5.3 x 6.6 cm = volume: 200 mL. Echogenicity is increased. There are multiple ureteral calculi seen in the mid ureter measuring up to 2 cm with resulting severe hydronephrosis. Multiple renal calculi are seen, measuring up to 1.1 cm. No mass is visualized. Bladder: Appears normal for degree of bladder distention. Other: A right pleural effusion is seen. IMPRESSION: Multiple obstructing left ureteral calculi resulting in severe hydronephrosis. Electronically Signed   By: Romona Curlsyler  Litton M.D.   On: 05/20/2020 21:09   ECHOCARDIOGRAM  COMPLETE  Result Date: 05/01/2020    ECHOCARDIOGRAM REPORT   Patient Name:   Austin Roberts Date of Exam: 05/02/2020 Medical Rec #:  [id number]       Height:       70.0 in Accession #:    [id number]      Weight:       180.1 lb Date of Birth:  April 29, 1929      BSA:          1.996 m Patient Age:    89 years        BP:           125/53 mmHg Patient Gender: M               HR:           70 bpm. Exam Location:  [place] Procedure: 2D Echo, Cardiac Doppler and Color Doppler Indications:    CHF-Acute Systolic  History:  Patient has no prior history of Echocardiogram examinations. CAD                 and Previous Myocardial Infarction, Prior CABG, COPD,                 Arrythmias:Atrial Fibrillation; Risk Factors:Dyslipidemia, Sleep                 Apnea and Former Smoker.  Sonographer:    Ross Ludwig RDCS (AE) Referring Phys: [id number] [place] General IMPRESSIONS  1. Left ventricular ejection fraction, by estimation, is 40 to 45%. The left ventricle has mildly decreased function. The left ventricle demonstrates global hypokinesis. There is moderate left ventricular hypertrophy. Left ventricular diastolic parameters are indeterminate.  2. Right ventricular systolic function is mildly reduced. The right ventricular size is mildly enlarged. There is moderately elevated pulmonary artery systolic pressure. The estimated right ventricular systolic pressure is 45.8 mmHg.  3. Left atrial size was moderately dilated.  4. Right atrial size was mildly dilated.  5. The mitral valve is degenerative. Mild to moderate mitral valve regurgitation.  6. Tricuspid valve regurgitation is mild to moderate.  7. The aortic valve is tricuspid, severely calcified. Aortic valve regurgitation is mild. Severe, low gradient aortic valve stenosis. Aortic valve mean gradient measures 25.0 mmHg. Aortic valve Vmax measures 3.45 m/s. Dimentionless index 0.18.  8. The inferior vena cava is normal in size with greater than 50% respiratory variability,  suggesting right atrial pressure of 3 mmHg. FINDINGS  Left Ventricle: Left ventricular ejection fraction, by estimation, is 40 to 45%. The left ventricle has mildly decreased function. The left ventricle demonstrates global hypokinesis. The left ventricular internal cavity size was normal in size. There is  moderate left ventricular hypertrophy. Left ventricular diastolic parameters are indeterminate. Right Ventricle: The right ventricular size is mildly enlarged. No increase in right ventricular wall thickness. Right ventricular systolic function is mildly reduced. There is moderately elevated pulmonary artery systolic pressure. The tricuspid regurgitant velocity is 3.27 m/s, and with an assumed right atrial pressure of 3 mmHg, the estimated right ventricular systolic pressure is 45.8 mmHg. Left Atrium: Left atrial size was moderately dilated. Right Atrium: Right atrial size was mildly dilated. Pericardium: There is no evidence of pericardial effusion. Mitral Valve: The mitral valve is degenerative in appearance. Moderate mitral annular calcification. Mild to moderate mitral valve regurgitation. Tricuspid Valve: The tricuspid valve is grossly normal. Tricuspid valve regurgitation is mild to moderate. Aortic Valve: The aortic valve is tricuspid. Aortic valve regurgitation is mild. Aortic regurgitation PHT measures 362 msec. Severe aortic stenosis is present. Mild to moderate aortic valve annular calcification. There is severe calcifcation of the aortic valve. Aortic valve mean gradient measures 25.0 mmHg. Aortic valve peak gradient measures 47.7 mmHg. Aortic valve area, by VTI measures 0.52 cm. Pulmonic Valve: The pulmonic valve was grossly normal. Pulmonic valve regurgitation is trivial. Aorta: The aortic root is normal in size and structure. Venous: The inferior vena cava is normal in size with greater than 50% respiratory variability, suggesting right atrial pressure of 3 mmHg. IAS/Shunts: No atrial level shunt  detected by color flow Doppler. Additional Comments: There is pleural effusion in the left lateral region.  LEFT VENTRICLE PLAX 2D LVIDd:         4.20 cm LVIDs:         4.10 cm LV PW:         1.60 cm LV IVS:        1.60 cm LVOT  diam:     1.90 cm LV SV:         39 LV SV Index:   20 LVOT Area:     2.84 cm  LV Volumes (MOD) LV vol d, MOD A2C: 107.0 ml LV vol d, MOD A4C: 72.4 ml LV vol s, MOD A2C: 56.4 ml LV vol s, MOD A4C: 62.9 ml LV SV MOD A2C:     50.6 ml LV SV MOD A4C:     72.4 ml LV SV MOD BP:      30.2 ml RIGHT VENTRICLE            IVC RV Basal diam:  4.00 cm    IVC diam: 1.70 cm RV Mid diam:    3.50 cm RV S prime:     5.43 cm/s TAPSE (M-mode): 1.1 cm LEFT ATRIUM             Index       RIGHT ATRIUM           Index LA diam:        4.80 cm 2.40 cm/m  RA Area:     21.90 cm LA Vol (A2C):   85.2 ml 42.68 ml/m RA Volume:   66.60 ml  33.36 ml/m LA Vol (A4C):   88.2 ml 44.18 ml/m LA Biplane Vol: 87.0 ml 43.58 ml/m  AORTIC VALVE AV Area (Vmax):    0.44 cm AV Area (Vmean):   0.47 cm AV Area (VTI):     0.52 cm AV Vmax:           345.40 cm/s AV Vmean:          225.200 cm/s AV VTI:            0.758 m AV Peak Grad:      47.7 mmHg AV Mean Grad:      25.0 mmHg LVOT Vmax:         53.16 cm/s LVOT Vmean:        37.320 cm/s LVOT VTI:          0.139 m LVOT/AV VTI ratio: 0.18 AI PHT:            362 msec  AORTA Ao Root diam: 3.60 cm Ao Asc diam:  3.40 cm TRICUSPID VALVE TR Peak grad:   42.8 mmHg TR Vmax:        327.00 cm/s  SHUNTS Systemic VTI:  0.14 m Systemic Diam: 1.90 cm Nona Dell MD Electronically signed by Nona Dell MD Signature Date/Time: 02-May-2020/4:59:47 PM    Final         Scheduled Meds: . aspirin EC  81 mg Oral Daily  . budesonide  0.5 mg Nebulization BID  . cholecalciferol  1,000 Units Oral Daily  . dronabinol  2.5 mg Oral BID AC  . feeding supplement (ENSURE ENLIVE)  237 mL Oral TID BM  . heparin  5,000 Units Subcutaneous Q8H  . mouth rinse  15 mL Mouth Rinse BID  . morphine CONCENTRATE   5 mg Sublingual TID  . omega-3 acid ethyl esters  1 g Oral Daily  . simvastatin  40 mg Oral QHS  . sodium chloride flush  3 mL Intravenous Q12H   Continuous Infusions: . sodium chloride            Glade Lloyd, MD [facility] 05/17/2020, 8:01 AM

## 2020-05-22 NOTE — Progress Notes (Signed)
Pt passed at 18:37pm, this nurse was at bedside. Victorino December RN was the 2nd the verify. [facility] was called, not a medical exam case. This nurse performed post mortem care. Awaiting the death cert and transfer to the morgue.

## 2020-05-22 NOTE — Progress Notes (Addendum)
Patient ID: Austin Roberts, male   DOB: 03-10-1929, 86 y.o.   MRN: [id number]  I have finally been able to reach daughter [place] to notify her that patient is actively dying. While on the phone, bedside RN comes out of room to let me know patient has passed. I then gently inform daughter that patient has passed. I offer reassurance that this was an unforseeable rapid decline in his condition but that he passed peacefully. Emotional support provided. Daughter will call patient placement with name of funeral home in the next few hours.   I have been present at the bedside or directly outside the room of this patient since I arrived to the department around 17:15, to coordinate care and assist with symptom management.   Thank you for allowing the Palliative Medicine Team to assist in the care of this patient.   Greater than 50%  of this time was spent counseling and coordinating care related to the above assessment and plan.  Total time: 35 minutes   Merry Proud, NP Palliative Medicine   Please contact Palliative Medicine Team phone at [phone] for questions and concerns.  For individual provider, see AMION.

## 2020-05-22 NOTE — Progress Notes (Addendum)
Patient ID: Austin Roberts, male   DOB: 1929/03/18, 86 y.o.   MRN: [id number]  Called by bedside RN with concern of rapidly worsening patient condition. When I arrive at  bedside, patient visibly dyspneic, using accessory muscles. Patient attempts to answer questions but is only able to mumble. I have ordered to start a continuous hydromorphone infusion. I have made several attempts to call daughter [place], both cell and home numbers, with no answer. Message left. Will continue attempts to reach her.   Thank you for allowing the Palliative Medicine Team to assist in the care of this patient.   Greater than 50%  of this time was spent counseling and coordinating care related to the above assessment and plan.  Total time: 15 minutes    Merry Proud, NP Palliative Medicine   Please contact Palliative Medicine Team phone at [phone] for questions and concerns.  For individual provider, see AMION.

## 2020-05-22 DEATH — deceased

## 2021-01-22 IMAGING — US US RENAL
2 series · 14 of 25 positions shown · non-contrast
Comparison: Same day chest radiograph. Renal ultrasound dated
12/12/2015.

CLINICAL DATA: Acute kidney injury

EXAM:
RENAL / URINARY TRACT ULTRASOUND COMPLETE

[Series 1: us renal · 13 of 33 slices shown]
[im 1/33]
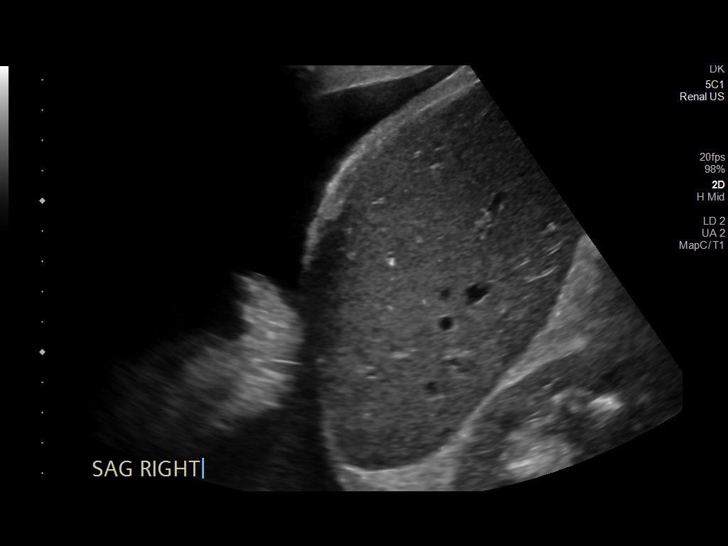
[im 3/33]
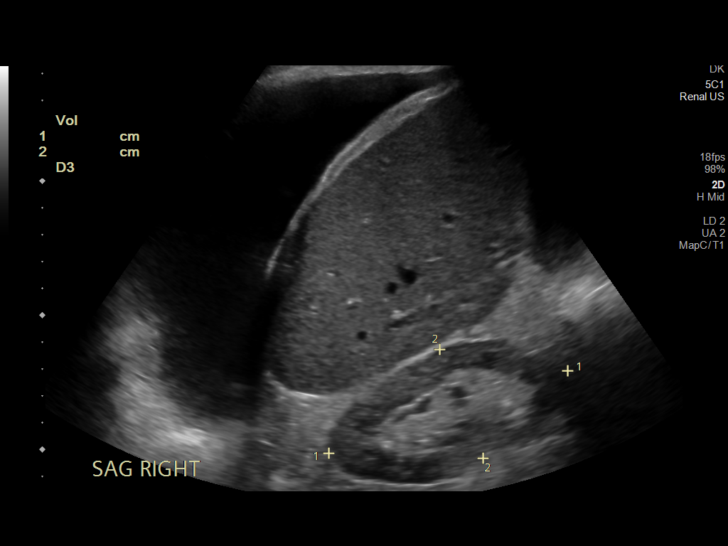
[im 6/33]
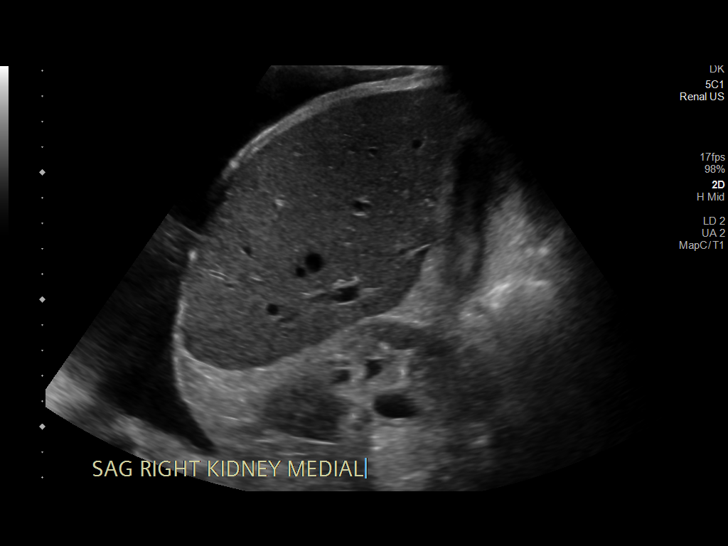
[im 9/33]
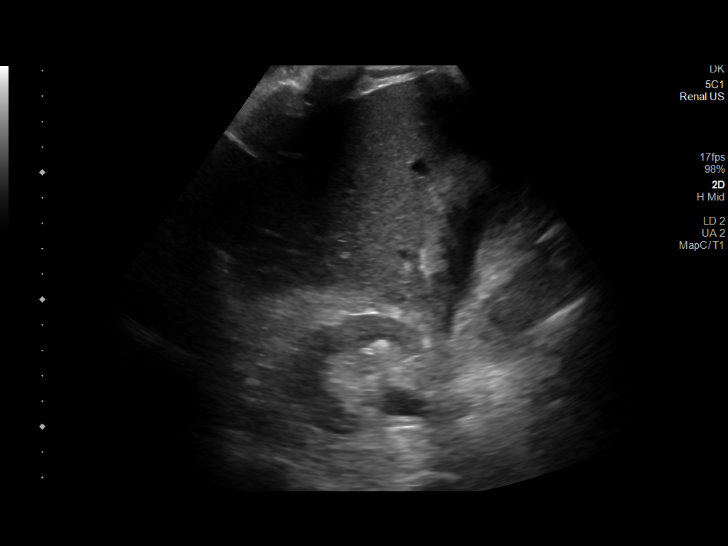
[im 12/33]
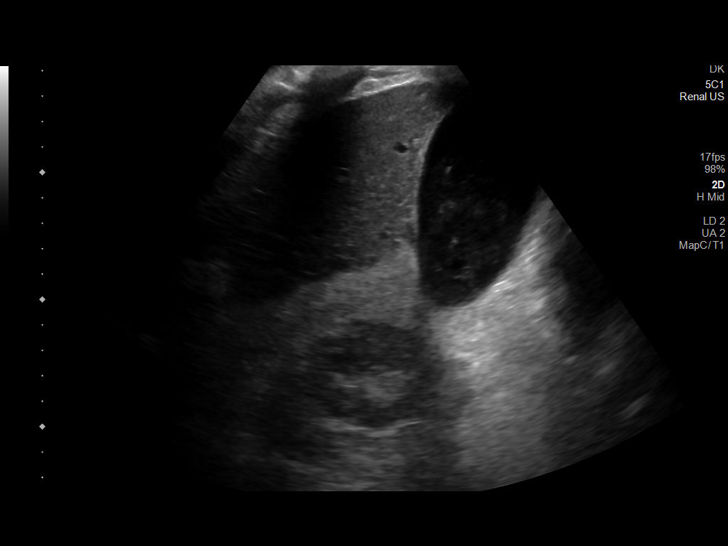
[im 13/33]
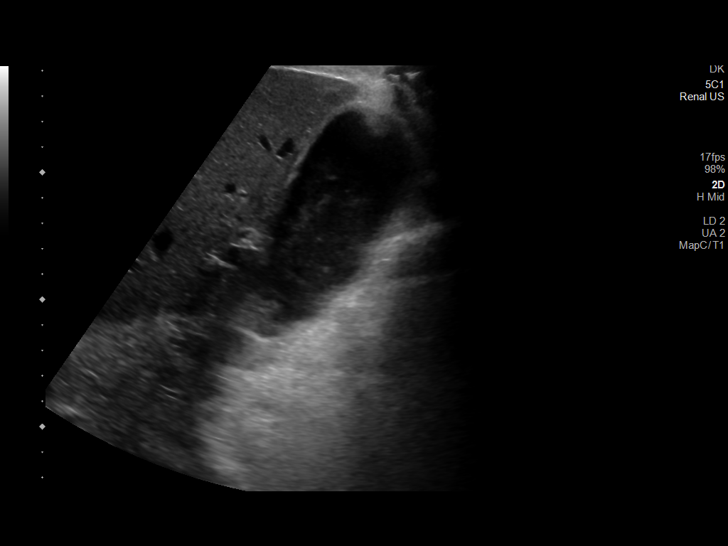
[im 16/33]
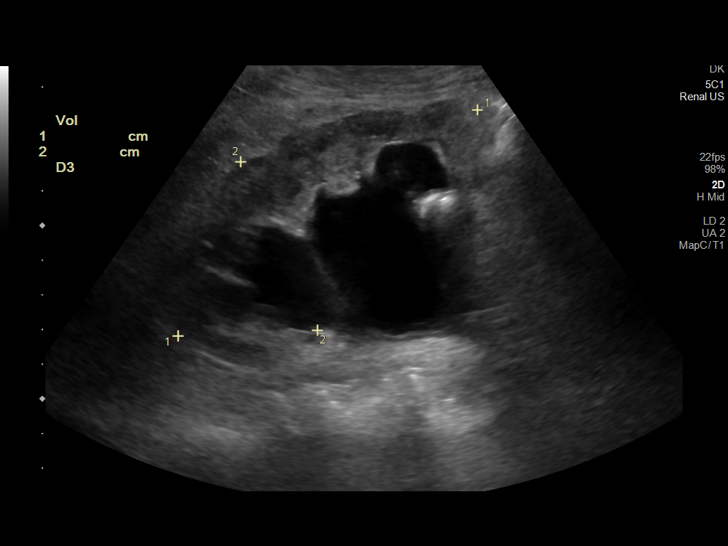
[im 19/33]
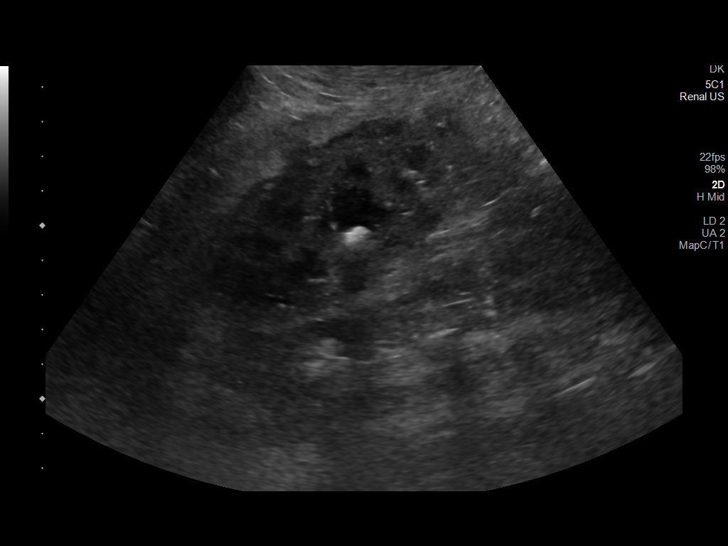
[im 21/33]
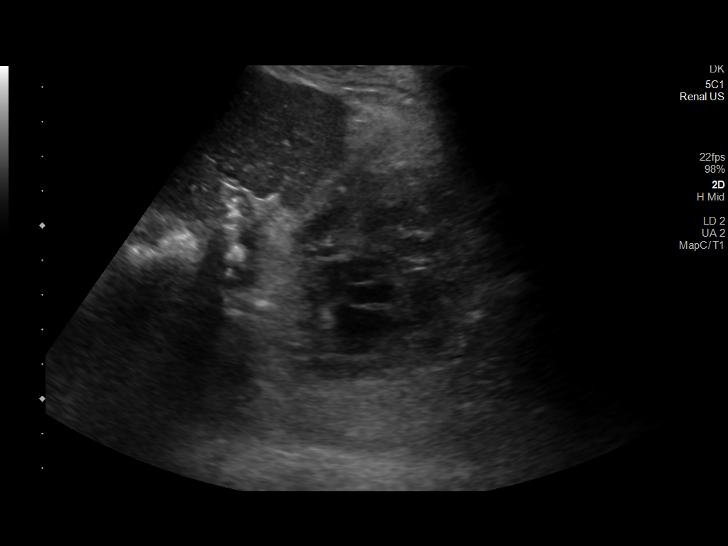
[im 23/33]
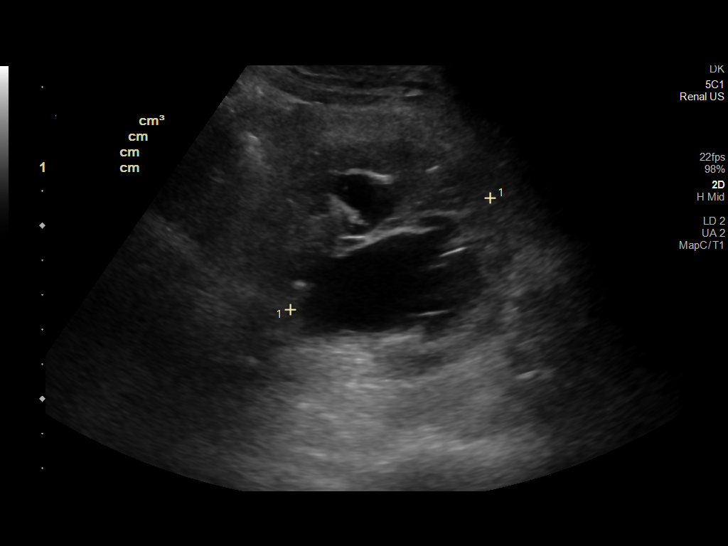
[im 26/33]
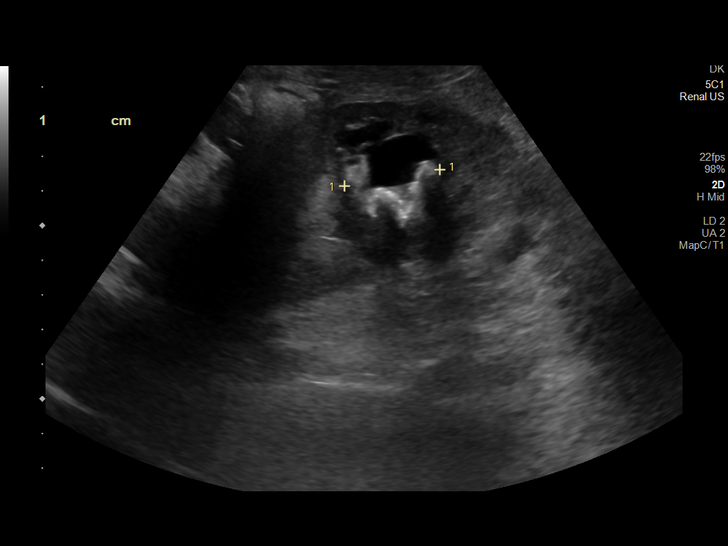
[im 28/33]
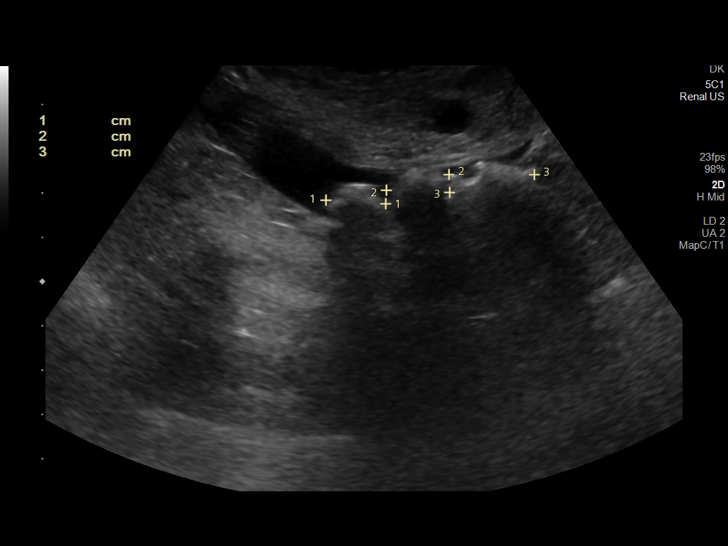
[im 31/33]
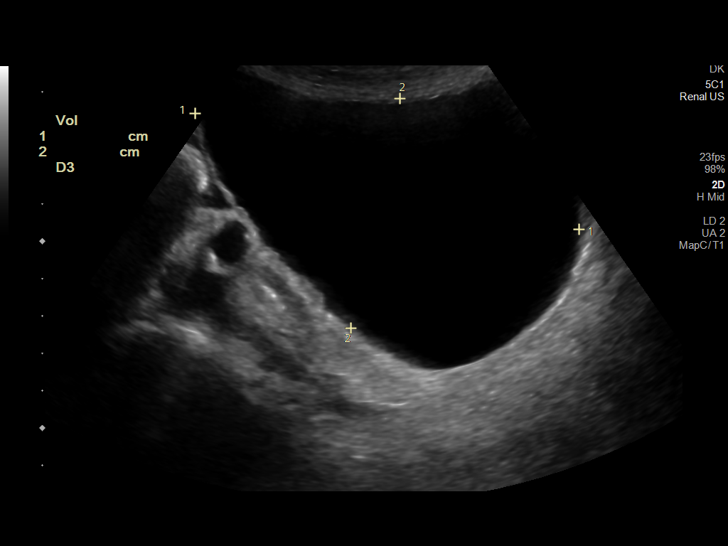

[Series 1001: renal us · 1 of 1 slices shown]
[im 1/1]
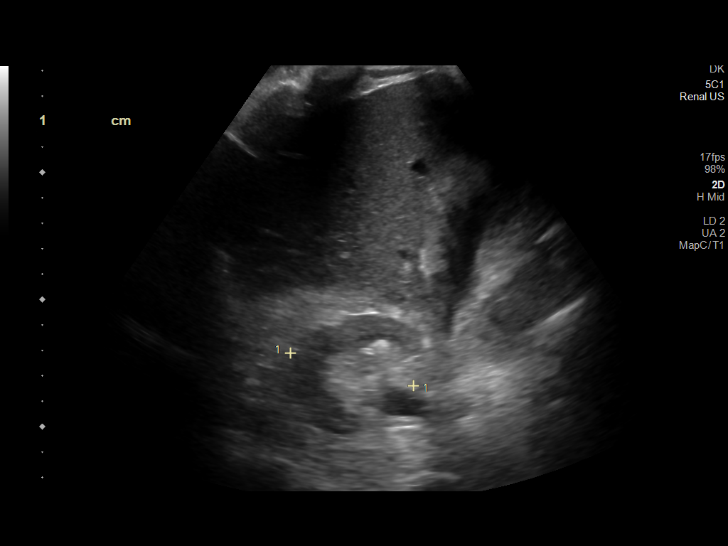

[14 of 25 positions shown; findings below may reference images not displayed]

FINDINGS: Right Kidney:

Renal measurements: 9.4 x 4.4 x 5.0 cm = volume: 103 mL .
Echogenicity is increased. A 6 mm nonobstructive calculus is seen.
No mass or hydronephrosis visualized.

Left Kidney:

Renal measurements: 10.8 x 5.3 x 6.6 cm = volume: 200 mL.
Echogenicity is increased. There are multiple ureteral calculi seen
in the mid ureter measuring up to 2 cm with resulting severe
hydronephrosis. Multiple renal calculi are seen, measuring up to
cm. No mass is visualized.

Bladder:

Appears normal for degree of bladder distention.

Other:

A right pleural effusion is seen.
IMPRESSION: Multiple obstructing left ureteral calculi resulting in severe
hydronephrosis.

## 2021-01-23 IMAGING — DX DG CHEST 1V
1 series · 1 of 1 positions shown · non-contrast
Comparison: Single-view of the chest 04/24/2020. PA and lateral
chest and CT chest 02/02/2017.

CLINICAL DATA: Patient admitted 04/24/2020 after a fall. History of
chronic heart failure.

EXAM:
CHEST  1 VIEW

[chest ap]
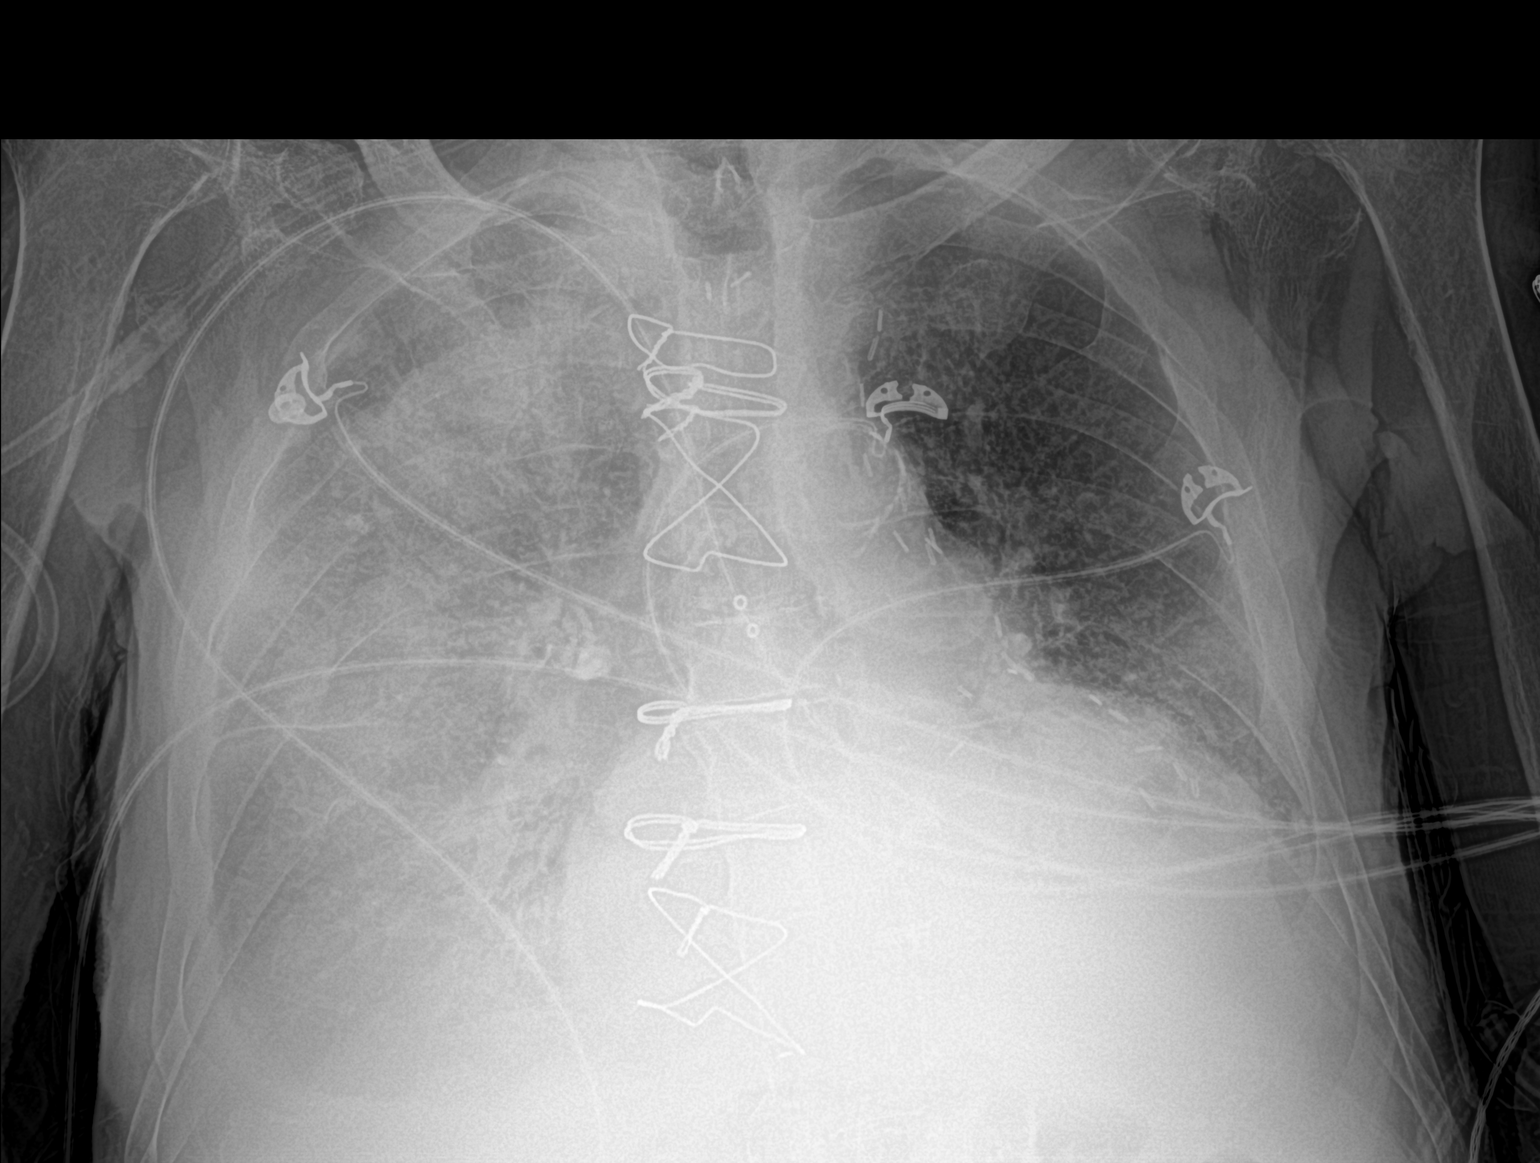

[1 of 1 positions shown; findings below may reference images not displayed]

FINDINGS: Extensive airspace disease throughout the right chest and a right
pleural effusion have worsened since the most recent exam. Smaller
left pleural effusion and left basilar airspace disease appear
unchanged. There is cardiomegaly. The patient is status post CABG.
No pneumothorax.
IMPRESSION: Right pleural effusion and extensive airspace disease throughout the
right chest likely due to pneumonia have worsened since the most
recent study.

No change in a small left pleural effusion and basilar airspace
disease.

Cardiomegaly.
